# Patient Record
Sex: Male | Born: 1960 | Race: Black or African American | Hispanic: No | Marital: Single | Smoking: Current some day smoker
Health system: Southern US, Community
[De-identification: ages and names within clinical notes are randomized; demographics above are authoritative.]

## PROBLEM LIST (undated history)

## (undated) DIAGNOSIS — I10 Essential (primary) hypertension: Secondary | ICD-10-CM

## (undated) HISTORY — DX: Essential (primary) hypertension: I10

## (undated) HISTORY — PX: HERNIA REPAIR: SHX51

---

## 2001-01-20 ENCOUNTER — Encounter: Payer: Self-pay | Admitting: Internal Medicine

## 2001-01-20 ENCOUNTER — Encounter: Admission: RE | Admit: 2001-01-20 | Discharge: 2001-01-20 | Payer: Self-pay | Admitting: Internal Medicine

## 2001-02-02 ENCOUNTER — Encounter: Payer: Self-pay | Admitting: Internal Medicine

## 2001-02-02 ENCOUNTER — Encounter: Admission: RE | Admit: 2001-02-02 | Discharge: 2001-02-02 | Payer: Self-pay | Admitting: Internal Medicine

## 2002-01-10 ENCOUNTER — Emergency Department (HOSPITAL_COMMUNITY): Admission: EM | Admit: 2002-01-10 | Discharge: 2002-01-10 | Payer: Self-pay

## 2002-01-20 ENCOUNTER — Emergency Department (HOSPITAL_COMMUNITY): Admission: EM | Admit: 2002-01-20 | Discharge: 2002-01-20 | Payer: Self-pay | Admitting: Emergency Medicine

## 2002-01-26 ENCOUNTER — Emergency Department (HOSPITAL_COMMUNITY): Admission: EM | Admit: 2002-01-26 | Discharge: 2002-01-26 | Payer: Self-pay | Admitting: Emergency Medicine

## 2003-08-06 ENCOUNTER — Encounter: Payer: Self-pay | Admitting: Internal Medicine

## 2003-08-06 ENCOUNTER — Ambulatory Visit (HOSPITAL_COMMUNITY): Admission: RE | Admit: 2003-08-06 | Discharge: 2003-08-06 | Payer: Self-pay | Admitting: Internal Medicine

## 2007-02-06 ENCOUNTER — Ambulatory Visit: Payer: Self-pay | Admitting: Family Medicine

## 2007-02-06 ENCOUNTER — Inpatient Hospital Stay (HOSPITAL_COMMUNITY): Admission: EM | Admit: 2007-02-06 | Discharge: 2007-02-08 | Payer: Self-pay | Admitting: Emergency Medicine

## 2007-03-01 ENCOUNTER — Encounter (INDEPENDENT_AMBULATORY_CARE_PROVIDER_SITE_OTHER): Payer: Self-pay | Admitting: Family Medicine

## 2007-03-01 ENCOUNTER — Ambulatory Visit: Payer: Self-pay | Admitting: Family Medicine

## 2007-03-01 DIAGNOSIS — L0291 Cutaneous abscess, unspecified: Secondary | ICD-10-CM | POA: Insufficient documentation

## 2007-03-01 DIAGNOSIS — F17200 Nicotine dependence, unspecified, uncomplicated: Secondary | ICD-10-CM

## 2007-03-01 DIAGNOSIS — L0390 Cellulitis, unspecified: Secondary | ICD-10-CM

## 2007-03-01 DIAGNOSIS — I10 Essential (primary) hypertension: Secondary | ICD-10-CM

## 2007-04-25 ENCOUNTER — Encounter (INDEPENDENT_AMBULATORY_CARE_PROVIDER_SITE_OTHER): Payer: Self-pay | Admitting: Family Medicine

## 2007-05-24 ENCOUNTER — Encounter (INDEPENDENT_AMBULATORY_CARE_PROVIDER_SITE_OTHER): Payer: Self-pay | Admitting: Family Medicine

## 2007-05-24 ENCOUNTER — Ambulatory Visit: Payer: Self-pay | Admitting: Family Medicine

## 2007-05-24 LAB — CONVERTED CEMR LAB
AST: 16 units/L
AST: 16 units/L
Albumin: 4.4 g/dL
Albumin: 4.4 g/dL
Alkaline Phosphatase: 64 units/L
BUN: 11 mg/dL
CO2, serum: 23 mmol/L
CO2, serum: 23 mmol/L
Calcium: 8.6 mg/dL
Chloride, Serum: 109 mmol/L
Creatinine, Ser: 0.92 mg/dL
Glucose, Bld: 95 mg/dL
Potassium, serum: 3.2 mmol/L
Sodium, serum: 143 mmol/L
Total Bilirubin: 0.4 mg/dL
Total Protein: 7 g/dL

## 2007-05-31 LAB — CONVERTED CEMR LAB
ALT: 18 units/L (ref 0–53)
Albumin: 4.4 g/dL (ref 3.5–5.2)
Alkaline Phosphatase: 64 units/L (ref 39–117)
BUN: 11 mg/dL (ref 6–23)
Glucose, Bld: 95 mg/dL (ref 70–99)
Potassium: 3.2 meq/L — ABNORMAL LOW (ref 3.5–5.3)

## 2010-03-18 ENCOUNTER — Ambulatory Visit (HOSPITAL_COMMUNITY): Admission: RE | Admit: 2010-03-18 | Discharge: 2010-03-18 | Payer: Self-pay | Admitting: Nephrology

## 2010-05-21 ENCOUNTER — Encounter: Admission: RE | Admit: 2010-05-21 | Discharge: 2010-05-21 | Payer: Self-pay | Admitting: Interventional Radiology

## 2010-11-02 HISTORY — PX: ADRENAL GLAND SURGERY: SHX544

## 2011-01-19 LAB — ALDOSTERONE
Aldosterone, Serum: 19 ng/dL
Aldosterone, Serum: 20 ng/dL
Aldosterone, Serum: 23 ng/dL
Aldosterone, Serum: 41 ng/dL
Aldosterone, Serum: 41 ng/dL
Aldosterone, Serum: 5590 ng/dL
Aldosterone, Serum: 82 ng/dL

## 2011-01-19 LAB — CBC: Platelets: 164 10*3/uL (ref 150–400)

## 2011-01-19 LAB — BASIC METABOLIC PANEL
GFR calc Af Amer: >60 mL/min (ref >60)
Glucose, Bld: 106 mg/dL — ABNORMAL HIGH (ref 70–99)
Potassium: 3 mEq/L — ABNORMAL LOW (ref 3.5–5.1)
Sodium: 141 mEq/L (ref 135–145)

## 2011-01-19 LAB — CORTISOL
Cortisol, Plasma: 1429.6 ug/dL
Cortisol, Plasma: 29.5 ug/dL
Cortisol, Plasma: 7.2 ug/dL
Cortisol, Plasma: 8 ug/dL

## 2011-01-19 LAB — APTT: aPTT: 29 seconds (ref 24–37)

## 2011-01-19 LAB — PROTIME-INR: Prothrombin Time: 13.4 seconds (ref 11.6–15.2)

## 2011-03-20 NOTE — Discharge Summary (Signed)
NAMESABIEN, UMLAND             ACCOUNT NO.:  [id number]   MEDICAL RECORD NO.:  [id number]          PATIENT TYPE:  INP   LOCATION:  5735                         FACILITY:  [facility]   PHYSICIAN:  Santiago Bumpers. Hensel, M.D.DATE OF BIRTH:  11-04-1960   DATE OF ADMISSION:  02/06/2007  DATE OF DISCHARGE:  02/08/2007                               DISCHARGE SUMMARY   DISCHARGE DIAGNOSES:  1. Cellulitis of the mons pubis.  2. Hypertension.   PRIMARY CARE PHYSICIAN:  The patient has previously been seen at [facility]; however, the patient desires to establish at the [facility] at the time of followup.   DISCHARGE MEDICATIONS:  1. Bactrim double-strength one tablet p.o. b.i.d. for 14 days.  2. Vicodin 5/500 one to two tablets every 6 hours as needed for pain,      dispensed #20, no refills.  3. The patient is to resume previous blood pressure medications of      Norvasc, lisinopril and Lopressor, the doses of which he is unaware      in the hospital.   CONSULTATIONS:  None.   PROCEDURES:  Ultrasound of the mons pubis on February 07, 2007 shows  inflammatory change in the fat in the left suprapubic area without  evidence of drainable soft tissue abscess.   LABORATORY DATA:  On admission, the patient had an elevated white blood  cell count of 12.5, hemoglobin 13.6, hematocrit of 40.3, platelets of  163,000 with a left neutrophilic shift of 80% and an ANC of 10.0.  Urinalysis and micro on admission were negative for infection.  Blood  cultures x2 obtained on February 06, 2007 showed no growth to date at the  time of discharge.  RPR was nonreactive.  The patient's white blood cell  count decreased to 12.3 on the second day of hospitalization, but had  significantly decreased to 7.9 at the time of discharge.  Discharge CBC  shows a white blood cell count of 7.9, hemoglobin 13.5, hematocrit of  39.6 and platelets of 188,000.  Creatinine on admission was 1.2.   BRIEF HOSPITAL  COURSE:  Please see full dictated history and physical  for full details of initial presentation and workup.  In brief, this is  a 50 year old African American male with past medical history  significant only for hypertension, who presents with severe cellulitis  of the mons pubis.   PROBLEM #1 - CELLULITIS:  The patient was initially admitted and placed  on IV vancomycin to cover MRSA.  The patient was maintained on IV  vancomycin for a total of approximately 36 hours, as the patient had not  significantly improved after 12 hours.  Ultrasound was obtained to  ensure that there was no presence of abscess that was drainable prior to  discharge.  Ultrasound was negative for abscess.  On the morning of  discharge, the patient's erythema and area of induration had  significantly decreased and the patient's cellulitis was spontaneously  draining a significant amount of purulent drainage.  The patient felt  significantly better, had been afebrile for at least 24 hours and white  blood cell count had normalized.  The patient was thus switched to p.o.  Bactrim to cover MRSA prior to discharge.  The patient was also given  Vicodin as needed at the time of discharge for pain control.   PROBLEM #2 - HYPERTENSION:  The patient was unaware of home medications  and dosages on admission, thus was not begun on any blood pressure  medications, as his blood pressure was only mildly elevated.  The  patient's blood pressure was somewhat increased prior to discharge to  150s to 170s over 80s to 90s and thus the patient was advised to resume  his home blood pressure medications at the time of discharge.   PROBLEM #3 - TOBACCO USE:  The patient had previously used occasional  tobacco prior to admission, however, reports not using any since last  Thursday and intends to quit.  The patient denied desiring any  medications to assist for quitting.   FOLLOWUP APPOINTMENTS:  1. The patient has a followup  appointment with Dr. Deirdre Peer at [facility] to establish care on March 01, 2007 at 3:30      p.m.  2. The patient was advised to call for walk-in hours and be seen      within the next week at [facility] to ensure resolution of      his infection.   PENDING RESULTS AND ISSUES TO BE FOLLOWED AT THE TIME OF DISCHARGE:  1. The patient was advised to bring his blood pressure medicines to      followup appointment for monitoring of his hypertension.  2. Followup blood cultures, which are pending at the time of      discharge.   ADDITIONAL INSTRUCTIONS:  1,  The patient is not to drive while taking  Vicodin.  1. The patient is to call sooner to Urgent Care for followup within      the next week or sooner if he has persistent fever or chills,      increased pain or his infection does not improve.  2. The patient is to bring his medications a followup visit.  3. The patient is to follow a low-sodium heart-healthy diet and has no      activity restrictions.  He is to continue applying warm compresses      at least 3-4 times daily to his area of cellulitis.   CONDITION ON DISCHARGE:  The patient was discharged home in stable  medical condition.     ______________________________  Drue Dun, M.D.    ______________________________  Santiago Bumpers. Leveda Anna, M.D.    EE/MEDQ  D:  02/08/2007  T:  02/09/2007  Job:  [id number]

## 2011-03-20 NOTE — H&P (Signed)
[name], [name]             ACCOUNT NO.:  [id number]   MEDICAL RECORD NO.:  [id number]          PATIENT TYPE:  INP   LOCATION:  5735                         FACILITY:  [facility]   PHYSICIAN:  Santiago Bumpers. Hensel, M.D.DATE OF BIRTH:  [birth year]   DATE OF ADMISSION:  02/06/2007  DATE OF DISCHARGE:                              HISTORY & PHYSICAL   CHIEF COMPLAINT:  A boil.   PRIMARY CARE PHYSICIAN:  [facility].   HISTORY OF PRESENT ILLNESS:  The patient is a 50 year old otherwise  healthy African American gentleman who complains of a ingrown hair in  his groin region just above the base of the penis; he noticed this 4  days ago.  The patient scratched the area with his fingernails in an  attempt to pop it.  Since then, the patient reports erythema,  swelling, purulent discharge from a pinpoint spot in the center of the  lesion.  He also notes subjective fever and chills.  The patient recalls  a prior occasion where he had a similar-type boil on the side of his  torso with spontaneous resolution.  The patient notes no dysuria or  neuromotor symptoms involving the penis.  The patient is voiding freely.   PAST MEDICAL HISTORY:  Hypertension.   PAST SURGICAL HISTORY:  Negative.   ALLERGIES:  Negative.   MEDICATIONS:  Negative.   FAMILY HISTORY:  Diabetes and hypertension.   SOCIAL HISTORY:  The patient is a Naval architect.  He lives in [place]  and has been here for 18 years.  He reports occasional tobacco and  moonshine use socially.  Denies illicit drugs of abuse.   REVIEW OF SYSTEMS:  Remarkable for fever, chills, pain and purulent  drainage with induration, swelling and erythema.   PHYSICAL EXAM:  VITALS:  T-max 99.4, heart rate 75, BP 119/65,  respirations 12, O2 SAT 95% on room air.  GENERAL:  The patient is awake, alert, in no apparent distress.  CARDIAC:  Regular rate and rhythm.  No murmur.  PULMONARY:  Clear to auscultation bilaterally.  GI:  Abdomen soft.   Positive bowel sounds.  GU:  A 5-cm indurated, nonfluctuant lesion above the base of the penis  on the mons pubis with an area of focal purulent drainage.  Notable for  tenderness to palpation on the area of induration.  Otherwise groin and  inguen nontender.  EXTREMITIES:  No edema.   LABORATORY DATA:  WBC 12.5, hemoglobin 13.6, platelets 163.  BMET within  normal limits.  Blood culture, UA, RPR pending.   ASSESSMENT AND PLAN:  The patient is a 50 year old gentleman with  cellulitis:  1. Cellulitis:  No clear focal target for surgical I&D.  Will observe      for 23 hours with intravenous vancomycin, then consider changing to      oral Bactrim.  Will follow fever curve and blood cultures.  2. Hypertension:  Blood pressure controlled.  3. Fluids, electrolytes and nutrition/gastrointestinal:  Regular diet.  4. Disposition:  Will follow overnight, likely home in the morning.      Towana Badger, M.D.  ______________________________  Santiago Bumpers Leveda Anna, M.D.    JP/MEDQ  D:  02/07/2007  T:  02/07/2007  Job:  [id number]

## 2011-05-12 ENCOUNTER — Other Ambulatory Visit (INDEPENDENT_AMBULATORY_CARE_PROVIDER_SITE_OTHER): Payer: Self-pay | Admitting: Surgery

## 2011-05-12 ENCOUNTER — Ambulatory Visit (HOSPITAL_COMMUNITY)
Admission: RE | Admit: 2011-05-12 | Discharge: 2011-05-12 | Disposition: A | Discharge status: Home / Self Care | Payer: BC Managed Care – PPO | Source: Ambulatory Visit | Attending: Surgery | Admitting: Surgery

## 2011-05-12 ENCOUNTER — Encounter (HOSPITAL_COMMUNITY): Payer: BC Managed Care – PPO

## 2011-05-12 DIAGNOSIS — D369 Benign neoplasm, unspecified site: Secondary | ICD-10-CM

## 2011-05-12 DIAGNOSIS — D3500 Benign neoplasm of unspecified adrenal gland: Secondary | ICD-10-CM | POA: Insufficient documentation

## 2011-05-12 DIAGNOSIS — Z01818 Encounter for other preprocedural examination: Secondary | ICD-10-CM | POA: Insufficient documentation

## 2011-05-12 DIAGNOSIS — Z01812 Encounter for preprocedural laboratory examination: Secondary | ICD-10-CM | POA: Insufficient documentation

## 2011-05-12 DIAGNOSIS — Z01810 Encounter for preprocedural cardiovascular examination: Secondary | ICD-10-CM | POA: Insufficient documentation

## 2011-05-12 LAB — COMPREHENSIVE METABOLIC PANEL
AST: 19 U/L (ref 0–37)
BUN: 13 mg/dL (ref 6–23)
CO2: 30 mEq/L (ref 19–32)
Chloride: 104 mEq/L (ref 96–112)
Creatinine, Ser: 0.95 mg/dL (ref 0.50–1.35)
GFR calc non Af Amer: >60 mL/min (ref >60)
Total Bilirubin: 0.5 mg/dL (ref 0.3–1.2)

## 2011-05-12 LAB — DIFFERENTIAL
Basophils Absolute: 0 10*3/uL (ref 0.0–0.1)
Eosinophils Relative: 1 % (ref 0–5)
Lymphocytes Relative: 33 % (ref 12–46)
Lymphs Abs: 1.7 10*3/uL (ref 0.7–4.0)
Monocytes Absolute: 0.4 10*3/uL (ref 0.1–1.0)

## 2011-05-12 LAB — CBC
HCT: 42.6 % (ref 39.0–52.0)
MCHC: 34 g/dL (ref 30.0–36.0)
MCV: 83.4 fL (ref 78.0–100.0)
RDW: 14 % (ref 11.5–15.5)

## 2011-05-12 LAB — SURGICAL PCR SCREEN: MRSA, PCR: NEGATIVE

## 2011-05-12 LAB — URINALYSIS, ROUTINE W REFLEX MICROSCOPIC
Bilirubin Urine: NEGATIVE
Hgb urine dipstick: NEGATIVE
Ketones, ur: NEGATIVE mg/dL
Protein, ur: NEGATIVE mg/dL
Urobilinogen, UA: 0.2 mg/dL (ref 0.0–1.0)

## 2011-05-19 ENCOUNTER — Other Ambulatory Visit (INDEPENDENT_AMBULATORY_CARE_PROVIDER_SITE_OTHER): Payer: Self-pay | Admitting: Surgery

## 2011-05-19 ENCOUNTER — Ambulatory Visit (HOSPITAL_COMMUNITY)
Admission: RE | Admit: 2011-05-19 | Discharge: 2011-05-21 | Disposition: A | Discharge status: Home / Self Care | Payer: BC Managed Care – PPO | Source: Ambulatory Visit | Attending: Surgery | Admitting: Surgery

## 2011-05-19 DIAGNOSIS — Z01818 Encounter for other preprocedural examination: Secondary | ICD-10-CM | POA: Insufficient documentation

## 2011-05-19 DIAGNOSIS — F17200 Nicotine dependence, unspecified, uncomplicated: Secondary | ICD-10-CM | POA: Insufficient documentation

## 2011-05-19 DIAGNOSIS — E269 Hyperaldosteronism, unspecified: Secondary | ICD-10-CM | POA: Insufficient documentation

## 2011-05-19 DIAGNOSIS — E669 Obesity, unspecified: Secondary | ICD-10-CM | POA: Insufficient documentation

## 2011-05-19 DIAGNOSIS — E876 Hypokalemia: Secondary | ICD-10-CM | POA: Insufficient documentation

## 2011-05-19 DIAGNOSIS — Z01810 Encounter for preprocedural cardiovascular examination: Secondary | ICD-10-CM | POA: Insufficient documentation

## 2011-05-19 DIAGNOSIS — I10 Essential (primary) hypertension: Secondary | ICD-10-CM | POA: Insufficient documentation

## 2011-05-19 DIAGNOSIS — Z01812 Encounter for preprocedural laboratory examination: Secondary | ICD-10-CM | POA: Insufficient documentation

## 2011-05-19 DIAGNOSIS — D3500 Benign neoplasm of unspecified adrenal gland: Principal | ICD-10-CM | POA: Insufficient documentation

## 2011-05-19 NOTE — Op Note (Signed)
NAMEHENOK, [name]             ACCOUNT NO.:  [id number]  MEDICAL RECORD NO.:  [id number]  LOCATION:  0010                         FACILITY:  [facility]  PHYSICIAN:  Velora Heckler, MD      DATE OF BIRTH:  1961-04-02  DATE OF PROCEDURE:  05/19/2011                               OPERATIVE REPORT   PREOPERATIVE DIAGNOSIS:  Hyperaldosteronism, left adrenal adenoma.  POSTOPERATIVE DIAGNOSIS:  Hyperaldosteronism, left adrenal adenoma.  PROCEDURE:  Laparoscopic left adrenalectomy.  SURGEON:  Velora Heckler, MD, FACS  ASSISTANT:  Adolph Pollack, MD, FACS  ANESTHESIA:  General.  ESTIMATED BLOOD LOSS:  Minimal.  PREPARATION:  ChloraPrep.  COMPLICATIONS:  None.  INDICATIONS:  The patient is a 49 year old black male referred by Dr. Casimiro Needle with hypertension, hyperaldosteronism, and left adrenal adenoma.  The patient has been prepared and brought to the operating room for left adrenalectomy.  BODY OF REPORT:  Procedure is done in OR #10 at the [facility].  The patient is brought to the operating room, placed in supine position on the operating room table.  Following administration of general anesthesia, the patient is turned to a right lateral decubitus position on the bean bag and the bean bag was inflated.  The patient was then prepped and draped in usual strict aseptic fashion.  Utilizing an 11-mm OptiVu trocar, the peritoneal cavity is accessed safely.  Pneumoperitoneum was established.  Operative ports are placed along the left costal margin in the anterior axillary line, the midaxillary line, and the posterior axillary line.  The splenic flexure of the colon is mobilized using the harmonic scalpel to divide the peritoneal attachments.  This exposes the lateral aspect of the left kidney.  The peritoneum lateral to the spleen is incised with a harmonic scalpel.  Dissection was carried cephalad around the spleen.  This allows the spleen and pancreas  to be mobilized on a pedicle medially. This exposes the area of the left adrenal gland and provides good access to the renal hilum.  Good hemostasis is maintained.  A fourth trocar was placed in the left upper quadrant of the abdomen with a 5-mm port. Dissection is then begun on the medial aspect of the kidney.  The adrenal gland is identified.  Using the harmonic scalpel, the adrenal gland is mobilized.  Vascular tributaries were divided with the harmonic scalpel.  Adrenal gland is mobilized off the surface of the kidney. Dissection is carried down to the renal hilum.  The adrenal vein was then gently dissected out with blunt dissection.  It is occluded with two 10-mm Ligaclips.  It is then transected sharply with scissors. Remaining attachments of the adrenal gland to the renal hilum were divided with the harmonic scalpel.  A small posterior venous tributary is divided between Ligaclips with harmonic scalpel.  The entire adrenal gland was then excised.  It is placed into an EndoCatch bag and withdrawn through the 12-mm trocar site without difficulty.  It is opened on the back table.  The adrenal gland is incised and the adenoma was clearly identified within the adrenal parenchyma.  It is submitted in its entirety to pathology for review.  Left upper quadrant was  inspected for hemostasis.  Fluid is evacuated. It was irrigated.  There is no evidence of bleeding.  Surgicel was placed in the operative field at the renal hilum and along these splenic bed.  Pneumoperitoneum was released.  Ports were removed under direct vision and good hemostasis is noted at all port sites.  Pneumoperitoneum was evacuated.  Port sites were anesthetized with local Exparel, local anesthetic.  Skin incisions were then closed with interrupted 4-0 Monocryl subcuticular sutures.  Wounds were washed and dried and Benzoin and Steri-Strips are applied.  Sterile dressings were applied.  The patient is awakened from  anesthesia and brought to the recovery room. The patient tolerated the procedure well.   Velora Heckler, MD, FACS     TMG/MEDQ  D:  05/19/2011  T:  05/19/2011  Job:  [id number]  cc:   Mindi Slicker. Lowell Guitar, M.D. Fax: [phone]  Electronically Signed by Darnell Level MD on 05/19/2011 12:50:02 PM

## 2011-05-20 LAB — BASIC METABOLIC PANEL
BUN: 12 mg/dL (ref 6–23)
CO2: 33 mEq/L — ABNORMAL HIGH (ref 19–32)
Calcium: 9.6 mg/dL (ref 8.4–10.5)
Chloride: 102 mEq/L (ref 96–112)
Creatinine, Ser: 1.12 mg/dL (ref 0.50–1.35)
Glucose, Bld: 108 mg/dL — ABNORMAL HIGH (ref 70–99)

## 2011-05-20 LAB — CBC
HCT: 41.4 % (ref 39.0–52.0)
MCH: 28.3 pg (ref 26.0–34.0)
MCV: 85 fL (ref 78.0–100.0)
Platelets: 171 10*3/uL (ref 150–400)
RBC: 4.87 MIL/uL (ref 4.22–5.81)
WBC: 7 10*3/uL (ref 4.0–10.5)

## 2011-05-27 ENCOUNTER — Other Ambulatory Visit (INDEPENDENT_AMBULATORY_CARE_PROVIDER_SITE_OTHER): Payer: Self-pay | Admitting: Surgery

## 2011-05-27 LAB — BASIC METABOLIC PANEL
BUN: 18 mg/dL (ref 6–23)
CO2: 28 mEq/L (ref 19–32)
Calcium: 9.8 mg/dL (ref 8.4–10.5)
Chloride: 105 mEq/L (ref 96–112)
Creat: 1.11 mg/dL (ref 0.50–1.35)
Glucose, Bld: 72 mg/dL (ref 70–99)

## 2011-06-16 ENCOUNTER — Encounter (INDEPENDENT_AMBULATORY_CARE_PROVIDER_SITE_OTHER): Payer: Self-pay | Admitting: Surgery

## 2011-06-17 ENCOUNTER — Encounter (INDEPENDENT_AMBULATORY_CARE_PROVIDER_SITE_OTHER): Payer: Self-pay | Admitting: Surgery

## 2011-06-17 ENCOUNTER — Ambulatory Visit (INDEPENDENT_AMBULATORY_CARE_PROVIDER_SITE_OTHER): Payer: BC Managed Care – PPO | Admitting: Surgery

## 2011-06-17 VITALS — BP 136/92 | HR 68 | Temp 96.8°F

## 2011-06-17 DIAGNOSIS — D3500 Benign neoplasm of unspecified adrenal gland: Secondary | ICD-10-CM

## 2011-06-17 NOTE — Progress Notes (Signed)
Visit Diagnoses: 1. Adrenal cortical adenoma     HISTORY: The patient is a 50 year old black male who underwent left adrenalectomy in July 2012 for cortical adenoma. Postoperatively his antihypertensive regimen has been decreased by Dr. Lowell Guitar. He has also decreased his dose of potassium supplements. The patient returns today for postoperative evaluation.   EXAM: Laparoscopic incisions along the left costal margin are well healed. No sign of herniation. No sign of infection. No tenderness.   IMPRESSION: Left adrenalectomy for cortical adenoma   PLAN: Patient will be released to return to work without restriction on July 04, 2011. He will continue to followup with his primary physician. He will return to see me as needed.   Velora Heckler, MD, FACS General & Endocrine Surgery [facility]

## 2011-07-08 ENCOUNTER — Encounter: Payer: Self-pay | Admitting: Surgery

## 2011-11-11 ENCOUNTER — Ambulatory Visit: Payer: Self-pay

## 2011-11-12 ENCOUNTER — Encounter (INDEPENDENT_AMBULATORY_CARE_PROVIDER_SITE_OTHER): Payer: Self-pay

## 2012-01-20 ENCOUNTER — Encounter (INDEPENDENT_AMBULATORY_CARE_PROVIDER_SITE_OTHER): Payer: Self-pay

## 2014-03-09 ENCOUNTER — Emergency Department (HOSPITAL_COMMUNITY)
Admission: EM | Admit: 2014-03-09 | Discharge: 2014-03-10 | Disposition: A | Discharge status: Home / Self Care | Payer: BC Managed Care – PPO | Attending: Emergency Medicine | Admitting: Emergency Medicine

## 2014-03-09 ENCOUNTER — Encounter (HOSPITAL_COMMUNITY): Payer: Self-pay | Admitting: Emergency Medicine

## 2014-03-09 DIAGNOSIS — L03319 Cellulitis of trunk, unspecified: Principal | ICD-10-CM

## 2014-03-09 DIAGNOSIS — L02219 Cutaneous abscess of trunk, unspecified: Secondary | ICD-10-CM | POA: Insufficient documentation

## 2014-03-09 DIAGNOSIS — I10 Essential (primary) hypertension: Secondary | ICD-10-CM | POA: Insufficient documentation

## 2014-03-09 DIAGNOSIS — Z79899 Other long term (current) drug therapy: Secondary | ICD-10-CM | POA: Insufficient documentation

## 2014-03-09 DIAGNOSIS — L03311 Cellulitis of abdominal wall: Secondary | ICD-10-CM

## 2014-03-09 NOTE — ED Notes (Signed)
Pt arrived to the ED with a complaint of an abscess on the lower abdomen area that is rubbing his pants and has broken open with discharge.  Pt has been experiencing symptoms for 2 days.  Pt has had these type of abscess previously

## 2014-03-10 LAB — BASIC METABOLIC PANEL
BUN: 15 mg/dL (ref 6–23)
CALCIUM: 9 mg/dL (ref 8.4–10.5)
CO2: 25 meq/L (ref 19–32)
Chloride: 99 mEq/L (ref 96–112)
Creatinine, Ser: 1.13 mg/dL (ref 0.50–1.35)
GFR calc Af Amer: 85 mL/min — ABNORMAL LOW (ref >90)
GFR calc non Af Amer: 73 mL/min — ABNORMAL LOW (ref >90)
GLUCOSE: 101 mg/dL — AB (ref 70–99)
Potassium: 3.7 mEq/L (ref 3.7–5.3)
SODIUM: 137 meq/L (ref 137–147)

## 2014-03-10 LAB — CBC WITH DIFFERENTIAL/PLATELET
Basophils Absolute: 0 10*3/uL (ref 0.0–0.1)
Basophils Relative: 0 % (ref 0–1)
EOS PCT: 0 % (ref 0–5)
Eosinophils Absolute: 0 10*3/uL (ref 0.0–0.7)
HEMATOCRIT: 40.6 % (ref 39.0–52.0)
Hemoglobin: 14 g/dL (ref 13.0–17.0)
LYMPHS ABS: 1.3 10*3/uL (ref 0.7–4.0)
LYMPHS PCT: 13 % (ref 12–46)
MCH: 29.4 pg (ref 26.0–34.0)
MCHC: 34.5 g/dL (ref 30.0–36.0)
MCV: 85.1 fL (ref 78.0–100.0)
MONO ABS: 1.1 10*3/uL — AB (ref 0.1–1.0)
Monocytes Relative: 11 % (ref 3–12)
Neutro Abs: 7.4 10*3/uL (ref 1.7–7.7)
Neutrophils Relative %: 76 % (ref 43–77)
PLATELETS: 162 10*3/uL (ref 150–400)
RBC: 4.77 MIL/uL (ref 4.22–5.81)
RDW: 14.7 % (ref 11.5–15.5)
WBC: 9.8 10*3/uL (ref 4.0–10.5)

## 2014-03-10 MED ORDER — CLINDAMYCIN HCL 300 MG PO CAPS: 300.0000 mg | ORAL_CAPSULE | Freq: Three times a day (TID) | ORAL | Status: DC

## 2014-03-10 MED ORDER — HYDROCODONE-ACETAMINOPHEN 5-325 MG PO TABS: 1.0000 | ORAL_TABLET | ORAL | Status: DC | PRN

## 2014-03-10 MED ORDER — ACETAMINOPHEN 325 MG PO TABS
650.0000 mg | ORAL_TABLET | Freq: Four times a day (QID) | ORAL | Status: DC | PRN
  Administered 2014-03-10: 650 mg via ORAL
  Filled 2014-03-10: qty 2

## 2014-03-10 MED ORDER — CLINDAMYCIN PHOSPHATE 600 MG/50ML IV SOLN
600.0000 mg | Freq: Once | INTRAVENOUS | Status: AC
Start: 2014-03-10 — End: 2014-03-10
  Administered 2014-03-10: 600 mg via INTRAVENOUS
  Filled 2014-03-10: qty 50

## 2014-03-10 NOTE — ED Provider Notes (Signed)
Medical screening examination/treatment/procedure(s) were performed by non-physician practitioner and as supervising physician I was immediately available for consultation/collaboration.   EKG Interpretation None        Katrine Radich L Reyes Fifield, MD 03/10/14 0742 

## 2014-03-10 NOTE — Discharge Instructions (Signed)
Take antibiotic as prescribed.  Take vicodin as prescribed for severe pain.  Do not drive within four hours of taking this medication (may cause drowsiness or confusion).   Please return to the ER on Sunday to have your abdominal wall rechecked.  Return sooner if redness continues to spread or pain worsens.   Cellulitis Cellulitis is an infection of the skin and the tissue beneath it. The infected area is usually red and tender. Cellulitis occurs most often in the arms and lower legs.  CAUSES  Cellulitis is caused by bacteria that enter the skin through cracks or cuts in the skin. The most common types of bacteria that cause cellulitis are Staphylococcus and Streptococcus. SYMPTOMS   Redness and warmth.  Swelling.  Tenderness or pain.  Fever. DIAGNOSIS  Your caregiver can usually determine what is wrong based on a physical exam. Blood tests may also be done. TREATMENT  Treatment usually involves taking an antibiotic medicine. HOME CARE INSTRUCTIONS   Take your antibiotics as directed. Finish them even if you start to feel better.  Keep the infected arm or leg elevated to reduce swelling.  Apply a warm cloth to the affected area up to 4 times per day to relieve pain.  Only take over-the-counter or prescription medicines for pain, discomfort, or fever as directed by your caregiver.  Keep all follow-up appointments as directed by your caregiver. SEEK MEDICAL CARE IF:   You notice red streaks coming from the infected area.  Your red area gets larger or turns dark in color.  Your bone or joint underneath the infected area becomes painful after the skin has healed.  Your infection returns in the same area or another area.  You notice a swollen bump in the infected area.  You develop new symptoms. SEEK IMMEDIATE MEDICAL CARE IF:   You have a fever.  You feel very sleepy.  You develop vomiting or diarrhea.  You have a general ill feeling (malaise) with muscle aches and  pains. MAKE SURE YOU:   Understand these instructions.  Will watch your condition.  Will get help right away if you are not doing well or get worse. Document Released: 07/29/2005 Document Revised: 04/19/2012 Document Reviewed: 01/04/2012 [facility] Patient Information 2014 [place].

## 2014-03-10 NOTE — ED Provider Notes (Signed)
CSN: [id number]     Arrival date & time 03/09/14  2251 History   First MD Initiated Contact with Patient 03/10/14 [phone]     Chief Complaint  Patient presents with  . Abscess  . Fever     (Consider location/radiation/quality/duration/timing/severity/associated sxs/prior Treatment) HPI History provided by pt.   Pt developed what appeared to be a pimple inferior to [employer] 2 days ago.  Has had gradually spreading surrounding erythema and increasing pain ever since.  There has been drainage of fluid as well as blood.  No associated fever or other skin changes.  No trauma to this area.  Immunocompetent.  Per prior chart, pt admitted for cellulitis of mons pubis in 2008.    Past Medical History  Diagnosis Date  . Hypertension    Past Surgical History  Procedure Laterality Date  . Hernia repair      umbilical   History reviewed. No pertinent family history. History  Substance Use Topics  . Smoking status: Never Smoker   . Smokeless tobacco: Not on file  . Alcohol Use: Yes     Comment: occcasional    Review of Systems  All other systems reviewed and are negative.     Allergies  Review of patient's allergies indicates no known allergies.  Home Medications   Prior to Admission medications   Medication Sig Start Date End Date Taking? Authorizing Provider  amLODipine (NORVASC) 10 MG tablet Take 10 mg by mouth 2 (two) times daily.     Yes Historical Provider, MD  lisinopril (PRINIVIL,ZESTRIL) 40 MG tablet Take 40 mg by mouth 2 (two) times daily.     Yes Historical Provider, MD  metoprolol tartrate (LOPRESSOR) 25 MG tablet Take 25 mg by mouth 2 (two) times daily.   Yes Historical Provider, MD  Multiple Vitamin (MULTIVITAMIN WITH MINERALS) TABS tablet Take 1 tablet by mouth daily.   Yes Historical Provider, MD   BP 126/66  Pulse 70  Temp(Src) 101.6 F (38.7 C) (Rectal)  Resp 18  SpO2 95% Physical Exam  Nursing note and vitals reviewed. Constitutional: He is oriented to person,  place, and time. He appears well-developed and well-nourished. No distress.  febrile  HENT:  Head: Normocephalic and atraumatic.  Eyes:  Normal appearance  Neck: Normal range of motion.  Cardiovascular: Normal rate and regular rhythm.   Pulmonary/Chest: Effort normal and breath sounds normal. No respiratory distress.  Abdominal: Soft. Bowel sounds are normal.  Erythema and warmth that extends nearly all the way across lower abdomen.  Genitalia spared.  Scabbed lesion w/ underlying induration inferior to naval.  Mildly ttp.  Visualized w/ bedside US and no subq fluid collection.  Musculoskeletal: Normal range of motion.  Neurological: He is alert and oriented to person, place, and time.  Skin: Skin is warm and dry. No rash noted.  Psychiatric: He has a normal mood and affect. His behavior is normal.    ED Course  Procedures (including critical care time) Labs Review Labs Reviewed  CBC WITH DIFFERENTIAL - Abnormal; Notable for the following:    Monocytes Absolute 1.1 (*)    All other components within normal limits  BASIC METABOLIC PANEL - Abnormal; Notable for the following:    Glucose, Bld 101 (*)    GFR calc non Af Amer 73 (*)    GFR calc Af Amer 85 (*)    All other components within normal limits    Imaging Review No results found.   EKG Interpretation None  MDM   Final diagnoses:  Cellulitis of abdominal wall    53yo immunocompetent M w/ h/o admission for cellulitis of mons pubis in 2008, presents w/ extensive cellulitis of lower abdominal wall.  No visible abscess when examined w/ bedside US.  Pt febrile but non-toxic and comfortable appearing.  Labs unremarkable. Tylenol administered.  Nursing staff drew margins of erythema.  IV clindamycin ordered.  Pain medication declined.  2:28 AM   Pt stable for discharge.  Prescribed clinda and vicodin.  Instructed to return in 2 days for recheck or sooner for progressing sx.  4:27 AM   Remer Macho,  PA-C 03/10/14 [phone]

## 2014-03-11 ENCOUNTER — Emergency Department (HOSPITAL_COMMUNITY): Payer: BC Managed Care – PPO

## 2014-03-11 ENCOUNTER — Inpatient Hospital Stay (HOSPITAL_COMMUNITY)
Admission: EM | Admit: 2014-03-11 | Discharge: 2014-03-13 | DRG: 872 | Disposition: A | Discharge status: Home / Self Care | Payer: BC Managed Care – PPO | Attending: Internal Medicine | Admitting: Internal Medicine

## 2014-03-11 ENCOUNTER — Encounter (HOSPITAL_COMMUNITY): Payer: Self-pay | Admitting: Emergency Medicine

## 2014-03-11 DIAGNOSIS — I10 Essential (primary) hypertension: Secondary | ICD-10-CM

## 2014-03-11 DIAGNOSIS — A419 Sepsis, unspecified organism: Principal | ICD-10-CM | POA: Diagnosis present

## 2014-03-11 DIAGNOSIS — D3500 Benign neoplasm of unspecified adrenal gland: Secondary | ICD-10-CM | POA: Diagnosis present

## 2014-03-11 DIAGNOSIS — L0390 Cellulitis, unspecified: Secondary | ICD-10-CM

## 2014-03-11 DIAGNOSIS — E669 Obesity, unspecified: Secondary | ICD-10-CM | POA: Diagnosis present

## 2014-03-11 DIAGNOSIS — Z79899 Other long term (current) drug therapy: Secondary | ICD-10-CM

## 2014-03-11 DIAGNOSIS — L03311 Cellulitis of abdominal wall: Secondary | ICD-10-CM | POA: Diagnosis present

## 2014-03-11 DIAGNOSIS — L02219 Cutaneous abscess of trunk, unspecified: Secondary | ICD-10-CM | POA: Diagnosis present

## 2014-03-11 DIAGNOSIS — F17200 Nicotine dependence, unspecified, uncomplicated: Secondary | ICD-10-CM

## 2014-03-11 DIAGNOSIS — L03319 Cellulitis of trunk, unspecified: Secondary | ICD-10-CM

## 2014-03-11 DIAGNOSIS — Z6836 Body mass index (BMI) 36.0-36.9, adult: Secondary | ICD-10-CM

## 2014-03-11 DIAGNOSIS — L0291 Cutaneous abscess, unspecified: Secondary | ICD-10-CM

## 2014-03-11 LAB — CBC WITH DIFFERENTIAL/PLATELET
Basophils Absolute: 0 10*3/uL (ref 0.0–0.1)
Basophils Relative: 0 % (ref 0–1)
EOS ABS: 0.1 10*3/uL (ref 0.0–0.7)
EOS PCT: 1 % (ref 0–5)
HEMATOCRIT: 41.8 % (ref 39.0–52.0)
Hemoglobin: 14.2 g/dL (ref 13.0–17.0)
LYMPHS ABS: 1.2 10*3/uL (ref 0.7–4.0)
Lymphocytes Relative: 13 % (ref 12–46)
MCH: 28.9 pg (ref 26.0–34.0)
MCHC: 34 g/dL (ref 30.0–36.0)
MCV: 85 fL (ref 78.0–100.0)
MONO ABS: 1 10*3/uL (ref 0.1–1.0)
MONOS PCT: 10 % (ref 3–12)
Neutro Abs: 7.3 10*3/uL (ref 1.7–7.7)
Neutrophils Relative %: 76 % (ref 43–77)
PLATELETS: 182 10*3/uL (ref 150–400)
RBC: 4.92 MIL/uL (ref 4.22–5.81)
RDW: 14.4 % (ref 11.5–15.5)
WBC: 9.6 10*3/uL (ref 4.0–10.5)

## 2014-03-11 LAB — BASIC METABOLIC PANEL
BUN: 17 mg/dL (ref 6–23)
CALCIUM: 9.2 mg/dL (ref 8.4–10.5)
CO2: 23 mEq/L (ref 19–32)
CREATININE: 1.19 mg/dL (ref 0.50–1.35)
Chloride: 100 mEq/L (ref 96–112)
GFR calc Af Amer: 80 mL/min — ABNORMAL LOW (ref >90)
GFR, EST NON AFRICAN AMERICAN: 69 mL/min — AB (ref >90)
GLUCOSE: 125 mg/dL — AB (ref 70–99)
Potassium: 3.8 mEq/L (ref 3.7–5.3)
SODIUM: 137 meq/L (ref 137–147)

## 2014-03-11 LAB — I-STAT CG4 LACTIC ACID, ED: Lactic Acid, Venous: 1.05 mmol/L (ref 0.5–2.2)

## 2014-03-11 MED ORDER — IOHEXOL 300 MG/ML  SOLN
100.0000 mL | Freq: Once | INTRAMUSCULAR | Status: AC | PRN
  Administered 2014-03-11: 100 mL via INTRAVENOUS

## 2014-03-11 MED ORDER — VANCOMYCIN HCL IN DEXTROSE 1-5 GM/200ML-% IV SOLN
1000.0000 mg | Freq: Once | INTRAVENOUS | Status: AC
  Administered 2014-03-11: 1000 mg via INTRAVENOUS
  Filled 2014-03-11: qty 200

## 2014-03-11 NOTE — ED Notes (Signed)
Pt states that he was previously seen for skin infection on lower abdomen and has come back for recheck. Area is red, swollen and hot to the touch. Redness circumference has grown. Alert and oriented.

## 2014-03-11 NOTE — ED Provider Notes (Signed)
CSN: [id number]     Arrival date & time 03/11/14  2021 History   This chart was scribed for non-physician practitioner Abigail Butts, PA-C, working with Tanna Furry, MD, by Neta Ehlers, ED Scribe. This patient was seen in room WA02/WA02 and the patient's care was started at 8:31 PM.  First MD Initiated Contact with Patient 03/11/14 2029     Chief Complaint  Patient presents with  . Cellulitis    The history is provided by the patient. No language interpreter was used.   HPI Comments: [name] is a 53 y.o. male, with a h/o HTN and skin infections, who presents to the Emergency Department complaining of a persistent skin infection to his lower abdomen which has been present for approximately four days. [name] was treated in the ED two days ago for the same issue; he was provided IV and PO clindamycin. The pt reports drainage from the site as well as redness, swelling, warmth, and mild pain associated with the site. Additionally, he has experienced chills. While he had a temperature of 101.6 F at the ED visit two days ago, his temperature in the ED today is 98.3 F.  The pt denies abdominal pain, nausea, and emesis. He reports a prior hospital admission seven years ago for similar symptoms, though to a different location. Record review shows that at that time patient was septic from the infection. He takes Norvasc, Lisinopril, and Lopressor for HTN. The pt denies a h/o medical issues including DM.   Past Medical History  Diagnosis Date  . Hypertension    Past Surgical History  Procedure Laterality Date  . Hernia repair      umbilical   History reviewed. No pertinent family history. History  Substance Use Topics  . Smoking status: Never Smoker   . Smokeless tobacco: Not on file  . Alcohol Use: Yes     Comment: occcasional    Review of Systems  Constitutional: Positive for fever (Resolved) and chills. Negative for diaphoresis, appetite change, fatigue and unexpected  weight change.  HENT: Negative for mouth sores.   Eyes: Negative for visual disturbance.  Respiratory: Negative for cough, chest tightness, shortness of breath and wheezing.   Cardiovascular: Negative for chest pain.  Gastrointestinal: Negative for nausea, vomiting, abdominal pain, diarrhea and constipation.  Endocrine: Negative for polydipsia, polyphagia and polyuria.  Genitourinary: Negative for dysuria, urgency, frequency and hematuria.  Musculoskeletal: Negative for back pain and neck stiffness.  Skin: Positive for color change. Negative for rash.  Allergic/Immunologic: Negative for immunocompromised state.  Neurological: Negative for syncope, light-headedness and headaches.  Hematological: Does not bruise/bleed easily.  Psychiatric/Behavioral: Negative for sleep disturbance. The patient is not nervous/anxious.   All other systems reviewed and are negative.   Allergies  Review of patient's allergies indicates no known allergies.  Home Medications   Prior to Admission medications   Medication Sig Start Date End Date Taking? Authorizing Provider  amLODipine (NORVASC) 10 MG tablet Take 10 mg by mouth 2 (two) times daily.      Historical Provider, MD  clindamycin (CLEOCIN) 300 MG capsule Take 1 capsule (300 mg total) by mouth 3 (three) times daily. 03/10/14   Arville Lime Schinlever, PA-C  HYDROcodone-acetaminophen (NORCO/VICODIN) 5-325 MG per tablet Take 1 tablet by mouth every 4 (four) hours as needed for moderate pain. 03/10/14   Arville Lime Schinlever, PA-C  lisinopril (PRINIVIL,ZESTRIL) 40 MG tablet Take 40 mg by mouth 2 (two) times daily.      Historical Provider,  MD  metoprolol tartrate (LOPRESSOR) 25 MG tablet Take 25 mg by mouth 2 (two) times daily.    Historical Provider, MD  Multiple Vitamin (MULTIVITAMIN WITH MINERALS) TABS tablet Take 1 tablet by mouth daily.    Historical Provider, MD   Triage Vitals: BP 134/83  Pulse 90  Temp(Src) 98.3 F (36.8 C) (Oral)  SpO2  96%  Physical Exam  Nursing note and vitals reviewed. Constitutional: He is oriented to person, place, and time. He appears well-developed and well-nourished. No distress.  HENT:  Head: Normocephalic and atraumatic.  Eyes: Conjunctivae are normal. No scleral icterus.  Neck: Normal range of motion.  Cardiovascular: Normal rate, regular rhythm, normal heart sounds and intact distal pulses.   No murmur heard. NO Tachycardia  Pulmonary/Chest: Effort normal and breath sounds normal. No respiratory distress.  Clear and equal breath sounds, no tachypnea  Abdominal: Soft. Bowel sounds are normal. He exhibits no distension. There is no tenderness. There is no rebound and no guarding.  No tenderness to palpation of the abdominal contents, mild superficial tenderness to palpation at the site of infection No rebound, guarding or peritoneal signs  Lymphadenopathy:    He has no cervical adenopathy.  Neurological: He is alert and oriented to person, place, and time. He exhibits normal muscle tone. Coordination normal.  Skin: Skin is warm and dry. He is not diaphoretic. There is erythema.  12 by 14 cm of induration just distal to umbilicus. Extending erythema almost to the axillary line and above the umbilicus. In the center of induration, an ulcerated lesion.    Psychiatric: He has a normal mood and affect.    ED Course  Procedures (including critical care time)  DIAGNOSTIC STUDIES: Oxygen Saturation is 96% on room air, normal by my interpretation.    COORDINATION OF CARE:  8:35 PM- Discussed treatment plan with patient, and the patient agreed to the plan.  The plan includes an ultra-sound.   8:40 PM- EMERGENCY DEPARTMENT US SOFT TISSUE INTERPRETATION "Study: Limited Ultrasound of the noted body part in comments below"  INDICATIONS: Soft tissue infection Multiple views of the body part are obtained with a multi-frequency linear probe  PERFORMED BY:  Myself  IMAGES ARCHIVED?:  Yes  SIDE:Midline  BODY PART:Abdominal wall  FINDINGS: No abcess noted and Cellulitis present  LIMITATIONS:  Body Habitus  INTERPRETATION:  No abcess noted and Cellulitis present  COMMENT:  Cobblestoning to suggest cellulitis, no large collection of fluid for which I and D  would be possible  8:42 PM- Consulted with Dr. Tanna Furry re the pt's course of care.    8:48 PM- Dr. Tanna Furry assessed the pt and recommended a CT scan, lab work, and IV antibiotics.   Labs Review Labs Reviewed  BASIC METABOLIC PANEL - Abnormal; Notable for the following:    Glucose, Bld 125 (*)    GFR calc non Af Amer 69 (*)    GFR calc Af Amer 80 (*)    All other components within normal limits  CULTURE, BLOOD (ROUTINE X 2)  CULTURE, BLOOD (ROUTINE X 2)  CBC WITH DIFFERENTIAL  I-STAT CG4 LACTIC ACID, ED    Imaging Review Ct Abdomen Pelvis W Contrast  03/11/2014   CLINICAL DATA:  Patient returns for follow-up of skin infection on the lower abdomen. The area is read red, swollen, and hot to touch. Cellulitis.  EXAM: CT ABDOMEN AND PELVIS WITH CONTRAST  TECHNIQUE: Multidetector CT imaging of the abdomen and pelvis was performed using the standard protocol  following bolus administration of intravenous contrast.  CONTRAST:  162mL OMNIPAQUE IOHEXOL 300 MG/ML  SOLN  COMPARISON:  None.  FINDINGS: Dependent atelectasis in the lung bases. Small subpleural nodules are demonstrated on the right, largest measuring 3 mm diameter. In a low risk patient, these are likely to be benign.  There is skin thickening and infiltration in the subcutaneous fat along the right lower quadrant lower abdominal wall anteriorly. This is consistent with the history of cellulitis. There is no evidence of any focal fluid collection to suggest abscess.  The liver, spleen, gallbladder, pancreas, adrenal glands, kidneys, abdominal aorta, inferior vena cava, and retroperitoneal lymph nodes are unremarkable. The stomach, small bowel, and colon  are decompressed. No free air or free fluid in the abdomen.  Pelvis: The appendix is normal. Prostate gland is enlarged, measuring 5.3 x 4.5 cm. No free or loculated pelvic fluid collections. No significant pelvic lymphadenopathy. No evidence of diverticulitis. Mild degenerative changes in the spine.  IMPRESSION: Changes consistent with cellulitis in the anterior abdominal wall, right lower quadrant. No abscess. Internal organs and bowel are unremarkable.   Electronically Signed   By: Lucienne Capers M.D.   On: 03/11/2014 21:41     EKG Interpretation None      MDM   Final diagnoses:  Cellulitis, abdominal wall    [name] presents with abdominal cellulitis. Patient has failed outpatient treatment with clindamycin; anticipate admission. The ultrasound does not show an abscess for which incision and drainage would be possible. We'll obtain lab work and CT scan to ensure no necrotizing fasciitis or deep set abscess.    9:50PM Patient remains alert, oriented nontoxic and nonseptic appearing. No leukocytosis lactic acid, the lactic acid and no evidence of sepsis.  We'll plan for admission.  Patient given vancomycin 1 g IV.  10:27 PM Discussed with hospitalist who will admit.  BP 134/83  Pulse 90  Temp(Src) 98.3 F (36.8 C) (Oral)  SpO2 96%  The patient was discussed with and seen by Dr. Tanna Furry who agrees with the treatment plan.   I personally performed the services described in this documentation, which was scribed in my presence. The recorded information has been reviewed and is accurate.   Jarrett Soho Anthonny Schiller, PA-C 03/11/14 2244

## 2014-03-11 NOTE — ED Notes (Signed)
Report to [facility] RN-ready for pt

## 2014-03-11 NOTE — H&P (Signed)
[facility] History and Physical  Patient: [name]  JKK:[id number]  DOB: [birth year]  DOS: the patient was seen and examined on 03/11/2014 PCP: No PCP Per Patient  Chief Complaint: stomach infection  HPI: JOSPH [name] is a 53 y.o. male with Past medical history of hypertension and left adrenalectomy. The pt presented with c/o stomach wall infection. He mentions that Thursday he has noted a boil on his abdomen which he ruptured manually. After that he started having progressive worsening swelling and redness of his abdominal wall. He also started having generalized malaise and chills. Denies any fever. He was seen in the ED on 8 and was started on clindamycin and sent home. He comes back today for followup and found to have further worsening of the swelling despite taking medications regularly. The patient denies any complaint of intra-abdominal pain, diarrhea, burning urination, constipation, active bleeding, pain swelling or redness in his scrotum of suprapubic area. He mentions he had similar infection in 2008 at which time he was in the hospital treated with vancomycin and discharged home on Bactrim.  The patient is coming from home. And at his baseline independent for most of his ADL.  Review of Systems: as mentioned in the history of present illness.  A Comprehensive review of the other systems is negative.  Past Medical History  Diagnosis Date  . Hypertension    Past Surgical History  Procedure Laterality Date  . Hernia repair      umbilical   Social History:  reports that he has never smoked. He does not have any smokeless tobacco history on file. He reports that he drinks alcohol. He reports that he does not use illicit drugs.  No Known Allergies  History reviewed. No pertinent family history.  Prior to Admission medications   Medication Sig Start Date End Date Taking? Authorizing Provider  amLODipine (NORVASC) 10 MG tablet Take 10 mg by mouth 2 (two)  times daily.     Yes Historical Provider, MD  clindamycin (CLEOCIN) 300 MG capsule Take 1 capsule (300 mg total) by mouth 3 (three) times daily. 03/10/14  Yes Catherine E Schinlever, PA-C  HYDROcodone-acetaminophen (NORCO/VICODIN) 5-325 MG per tablet Take 1 tablet by mouth every 4 (four) hours as needed for moderate pain. 03/10/14  Yes Catherine E Schinlever, PA-C  lisinopril (PRINIVIL,ZESTRIL) 40 MG tablet Take 40 mg by mouth 2 (two) times daily.     Yes Historical Provider, MD  metoprolol tartrate (LOPRESSOR) 25 MG tablet Take 25 mg by mouth 2 (two) times daily.   Yes Historical Provider, MD  Multiple Vitamin (MULTIVITAMIN WITH MINERALS) TABS tablet Take 1 tablet by mouth daily.   Yes Historical Provider, MD    Physical Exam: Filed Vitals:   03/11/14 2028 03/11/14 2300  BP: 134/83 139/85  Pulse: 90 78  Temp: 98.3 F (36.8 C) 100 F (37.8 C)  TempSrc: Oral Oral  Resp:  18  SpO2: 96% 95%    General: Alert, Awake and Oriented to Time, Place and Person. Appear in mild distress Eyes: PERRL ENT: Oral Mucosa clear moist. Neck: No JVD Cardiovascular: S1 and S2 Present, no Murmur, Peripheral Pulses Present Respiratory: Bilateral Air entry equal and Decreased, Clear to Auscultation,  no Crackles,no wheezes Abdomen: Bowel Sound Present, Soft and Non tender Diffuse redness infraumbilical skin with warmth and induration. Discrete swollen nodule with ulceration. Skin: no other Rash Extremities: no Pedal edema, no calf tenderness Neurologic: Grossly no focal neuro deficit.  Labs on Admission:  CBC:  Recent  Labs Lab 03/10/14 0130 03/11/14 2105  WBC 9.8 9.6  NEUTROABS 7.4 7.3  HGB 14.0 14.2  HCT 40.6 41.8  MCV 85.1 85.0  PLT 162 182    CMP     Component Value Date/Time   NA 137 03/11/2014 2105   K 3.8 03/11/2014 2105   CL 100 03/11/2014 2105   CO2 23 03/11/2014 2105   GLUCOSE 125* 03/11/2014 2105   BUN 17 03/11/2014 2105   CREATININE 1.19 03/11/2014 2105   CREATININE 1.11 05/27/2011  1238   CALCIUM 9.2 03/11/2014 2105   PROT 7.6 05/12/2011 1110   ALBUMIN 4.3 05/12/2011 1110   AST 19 05/12/2011 1110   ALT 24 05/12/2011 1110   ALKPHOS 71 05/12/2011 1110   BILITOT 0.5 05/12/2011 1110   GFRNONAA 69* 03/11/2014 2105   GFRAA 80* 03/11/2014 2105    No results found for this basename: LIPASE, AMYLASE,  in the last 168 hours No results found for this basename: AMMONIA,  in the last 168 hours  No results found for this basename: CKTOTAL, CKMB, CKMBINDEX, TROPONINI,  in the last 168 hours BNP (last 3 results) No results found for this basename: PROBNP,  in the last 8760 hours  Radiological Exams on Admission: Ct Abdomen Pelvis W Contrast  03/11/2014   CLINICAL DATA:  Patient returns for follow-up of skin infection on the lower abdomen. The area is read red, swollen, and hot to touch. Cellulitis.  EXAM: CT ABDOMEN AND PELVIS WITH CONTRAST  TECHNIQUE: Multidetector CT imaging of the abdomen and pelvis was performed using the standard protocol following bolus administration of intravenous contrast.  CONTRAST:  140mL OMNIPAQUE IOHEXOL 300 MG/ML  SOLN  COMPARISON:  None.  FINDINGS: Dependent atelectasis in the lung bases. Small subpleural nodules are demonstrated on the right, largest measuring 3 mm diameter. In a low risk patient, these are likely to be benign.  There is skin thickening and infiltration in the subcutaneous fat along the right lower quadrant lower abdominal wall anteriorly. This is consistent with the history of cellulitis. There is no evidence of any focal fluid collection to suggest abscess.  The liver, spleen, gallbladder, pancreas, adrenal glands, kidneys, abdominal aorta, inferior vena cava, and retroperitoneal lymph nodes are unremarkable. The stomach, small bowel, and colon are decompressed. No free air or free fluid in the abdomen.  Pelvis: The appendix is normal. Prostate gland is enlarged, measuring 5.3 x 4.5 cm. No free or loculated pelvic fluid collections. No  significant pelvic lymphadenopathy. No evidence of diverticulitis. Mild degenerative changes in the spine.  IMPRESSION: Changes consistent with cellulitis in the anterior abdominal wall, right lower quadrant. No abscess. Internal organs and bowel are unremarkable.   Electronically Signed   By: Lucienne Capers M.D.   On: 03/11/2014 21:41     Assessment/Plan Principal Problem:   Cellulitis, abdominal wall Active Problems:   HYPERTENSION, BENIGN   h/o left Adrenal cortical adenoma   1. Cellulitis, abdominal wall The patient is presenting with complaint of increasingly worsening cellulitis of his abdominal wall. CT scan of the abdomen is performed which is consistent with the same without any abscess. With this the patient will be admitted to the hospital for IV antibiotics, I would continue him on IV vancomycin. If his redness does not improve in 24 hours pseudomonal coverage should be added. Follow blood cultures  2. Hypertension Continue home antihypertensive medications  3. 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  Author: Berle Mull, MD [facility] Pager: [phone] 03/11/2014, 11:19 PM    If 7PM-7AM, please contact night-coverage [email or web address] Password [email or web address]

## 2014-03-12 ENCOUNTER — Encounter (HOSPITAL_COMMUNITY): Payer: Self-pay | Admitting: *Deleted

## 2014-03-12 DIAGNOSIS — D3500 Benign neoplasm of unspecified adrenal gland: Secondary | ICD-10-CM

## 2014-03-12 DIAGNOSIS — F17200 Nicotine dependence, unspecified, uncomplicated: Secondary | ICD-10-CM

## 2014-03-12 DIAGNOSIS — L0390 Cellulitis, unspecified: Secondary | ICD-10-CM

## 2014-03-12 DIAGNOSIS — L02219 Cutaneous abscess of trunk, unspecified: Secondary | ICD-10-CM

## 2014-03-12 DIAGNOSIS — L0291 Cutaneous abscess, unspecified: Secondary | ICD-10-CM

## 2014-03-12 DIAGNOSIS — I10 Essential (primary) hypertension: Secondary | ICD-10-CM

## 2014-03-12 DIAGNOSIS — L03319 Cellulitis of trunk, unspecified: Secondary | ICD-10-CM

## 2014-03-12 LAB — COMPREHENSIVE METABOLIC PANEL
ALK PHOS: 68 U/L (ref 39–117)
ALT: 21 U/L (ref 0–53)
AST: 17 U/L (ref 0–37)
Albumin: 3.4 g/dL — ABNORMAL LOW (ref 3.5–5.2)
BUN: 15 mg/dL (ref 6–23)
CO2: 22 mEq/L (ref 19–32)
Calcium: 8.9 mg/dL (ref 8.4–10.5)
Chloride: 100 mEq/L (ref 96–112)
Creatinine, Ser: 1.08 mg/dL (ref 0.50–1.35)
GFR calc Af Amer: 89 mL/min — ABNORMAL LOW (ref >90)
GFR calc non Af Amer: 77 mL/min — ABNORMAL LOW (ref >90)
GLUCOSE: 126 mg/dL — AB (ref 70–99)
Potassium: 3.7 mEq/L (ref 3.7–5.3)
SODIUM: 134 meq/L — AB (ref 137–147)
TOTAL PROTEIN: 6.9 g/dL (ref 6.0–8.3)
Total Bilirubin: 0.5 mg/dL (ref 0.3–1.2)

## 2014-03-12 LAB — PROTIME-INR
INR: 1.09 (ref 0.00–1.49)
Prothrombin Time: 13.9 seconds (ref 11.6–15.2)

## 2014-03-12 LAB — MRSA PCR SCREENING: MRSA by PCR: NEGATIVE

## 2014-03-12 LAB — CBC
HCT: 40 % (ref 39.0–52.0)
Hemoglobin: 13.5 g/dL (ref 13.0–17.0)
MCH: 28.5 pg (ref 26.0–34.0)
MCHC: 33.8 g/dL (ref 30.0–36.0)
MCV: 84.6 fL (ref 78.0–100.0)
Platelets: 170 10*3/uL (ref 150–400)
RBC: 4.73 MIL/uL (ref 4.22–5.81)
RDW: 14.3 % (ref 11.5–15.5)
WBC: 9 10*3/uL (ref 4.0–10.5)

## 2014-03-12 MED ORDER — ONDANSETRON HCL 4 MG PO TABS: 4.0000 mg | ORAL_TABLET | Freq: Four times a day (QID) | ORAL | Status: DC | PRN

## 2014-03-12 MED ORDER — METOPROLOL TARTRATE 25 MG PO TABS
25.0000 mg | ORAL_TABLET | Freq: Two times a day (BID) | ORAL | Status: DC
  Administered 2014-03-12 – 2014-03-13 (×4): 25 mg via ORAL
  Filled 2014-03-12 (×5): qty 1

## 2014-03-12 MED ORDER — LISINOPRIL 40 MG PO TABS
40.0000 mg | ORAL_TABLET | Freq: Two times a day (BID) | ORAL | Status: DC
  Administered 2014-03-12 – 2014-03-13 (×4): 40 mg via ORAL
  Filled 2014-03-12 (×5): qty 1

## 2014-03-12 MED ORDER — ENOXAPARIN SODIUM 40 MG/0.4ML ~~LOC~~ SOLN: 40.0000 mg | SUBCUTANEOUS | Status: DC

## 2014-03-12 MED ORDER — ACETAMINOPHEN 325 MG PO TABS: 650.0000 mg | ORAL_TABLET | Freq: Four times a day (QID) | ORAL | Status: DC | PRN

## 2014-03-12 MED ORDER — ONDANSETRON HCL 4 MG/2ML IJ SOLN: 4.0000 mg | Freq: Four times a day (QID) | INTRAMUSCULAR | Status: DC | PRN

## 2014-03-12 MED ORDER — HYDROCODONE-ACETAMINOPHEN 5-325 MG PO TABS
1.0000 | ORAL_TABLET | ORAL | Status: DC | PRN
Start: 2014-03-12 — End: 2014-03-13
  Administered 2014-03-12 – 2014-03-13 (×2): 1 via ORAL
  Filled 2014-03-12 (×2): qty 1

## 2014-03-12 MED ORDER — ENOXAPARIN SODIUM 80 MG/0.8ML ~~LOC~~ SOLN
65.0000 mg | SUBCUTANEOUS | Status: DC
  Administered 2014-03-12 – 2014-03-13 (×2): 65 mg via SUBCUTANEOUS
  Filled 2014-03-12 (×2): qty 0.8

## 2014-03-12 MED ORDER — ACETAMINOPHEN 650 MG RE SUPP: 650.0000 mg | Freq: Four times a day (QID) | RECTAL | Status: DC | PRN

## 2014-03-12 MED ORDER — VANCOMYCIN HCL 10 G IV SOLR
1250.0000 mg | Freq: Two times a day (BID) | INTRAVENOUS | Status: DC
  Administered 2014-03-12 – 2014-03-13 (×2): 1250 mg via INTRAVENOUS
  Filled 2014-03-12 (×3): qty 1250

## 2014-03-12 MED ORDER — AMLODIPINE BESYLATE 10 MG PO TABS
10.0000 mg | ORAL_TABLET | Freq: Two times a day (BID) | ORAL | Status: DC
  Administered 2014-03-12 – 2014-03-13 (×4): 10 mg via ORAL
  Filled 2014-03-12 (×5): qty 1

## 2014-03-12 MED ORDER — SODIUM CHLORIDE 0.9 % IV SOLN
INTRAVENOUS | Status: DC
  Administered 2014-03-12 (×2): via INTRAVENOUS

## 2014-03-12 MED ORDER — VANCOMYCIN HCL 10 G IV SOLR
1500.0000 mg | Freq: Once | INTRAVENOUS | Status: AC
  Administered 2014-03-12: 1500 mg via INTRAVENOUS
  Filled 2014-03-12: qty 1500

## 2014-03-12 NOTE — Progress Notes (Signed)
Nutrition Brief Note  Patient identified on the Malnutrition Screening Tool (MST) Report  Wt Readings from Last 15 Encounters:  03/11/14 286 lb (129.729 kg)  05/24/07 287 lb (130.182 kg)  03/01/07 291 lb 3.2 oz (132.087 kg)    Body mass index is 36.7 kg/(m^2). 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   Current diet order is Low Sodium, patient is consuming approximately 75% of meals at this time. Labs and medications reviewed.   Pt reported an unintentional wt loss of 15 lbs over two months. Denied any changes in appetite/PO intake. Pt interested in adapting healthier diet for additional weight loss; however pt appeared apphrehensive to initiate necessary diet changes after RD reviewed weight loss basics. Provided pt with "General Healthy Nutrition" and "Weight Loss Tips" handouts from the Academy of [employer].  No nutrition interventions warranted at this time. If nutrition issues arise, please consult RD.   Atlee Abide MS RD LDN Clinical Dietitian [phone]

## 2014-03-12 NOTE — Care Management Note (Signed)
CARE MANAGEMENT NOTE 03/12/2014  Patient:  [name], [name]   Account Number:  [id number]  Date Initiated:  03/12/2014  Documentation initiated by:  Joanette Silveria  Subjective/Objective Assessment:   53 yo male admitted with cellulitis.     Action/Plan:   Home when stable   Anticipated DC Date:     Anticipated DC Plan:  [place]  CM consult      Choice offered to / List presented to:  NA   DME arranged  NA      DME agency  NA     [place] arranged  NA      [place] agency  NA   Status of service:  Completed, signed off Medicare Important Message given?   (If response is "NO", the following Medicare IM given date fields will be blank) Date Medicare IM given:   Date Additional Medicare IM given:    Discharge Disposition:    Per UR Regulation:  Reviewed for med. necessity/level of care/duration of stay  If discussed at [place] of Stay Meetings, dates discussed:    Comments:  03/12/14 [place] Chart reviewed for utilization of services. No needs assessed at this time.

## 2014-03-12 NOTE — Progress Notes (Signed)
Progress Note   [name] UVO:[id number] DOB: June 16, 1961 DOA: 03/11/2014 PCP: Estanislado Emms, MD   Brief Narrative:   [name] is an 53 y.o. 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 He was originally evaluated in ED on 03/09/14 and sent home on clindamycin, but returned with worsening symptoms.  Assessment/Plan:   Principal Problem:   Sepsis secondary to Cellulitis, abdominal wall  Patient had a documented fever of 101.6, and has been tachycardic/tachypneic, meeting criteria for sepsis.  Continue IV vancomycin.  Followup blood cultures.  CT of the abdomen did not show any discrete abscess.  Send cultures from pustule since he failed outpatient therapy with clindamycin, which may help in choosing appropriate oral antibiotics at discharge. Active Problems:   Obesity II   HYPERTENSION, BENIGN  Continue amlodipine, lisinopril, and metoprolol.   h/o left Adrenal cortical adenoma  No active issues.   DVT Prophylaxis  Continue Lovenox.  Code Status: Full. Family Communication: No family at bedside. Disposition Plan: Home when stable.   IV Access:    Peripheral IV   Procedures:    None.   Medical Consultants:    None.   Other Consultants:    None.   Anti-Infectives:    Vancomycin 03/11/14--->  Subjective:   [name] tells me he had fevers prior to admission and that his abdomen is a bit sore, but feels better today. No nausea or vomiting.  Objective:    Filed Vitals:   03/11/14 2028 03/11/14 2300 03/11/14 2344 03/12/14 0500  BP: 134/83 139/85 118/59 135/76  Pulse: 90 78 76 70  Temp: 98.3 F (36.8 C) 100 F (37.8 C) 99.8 F (37.7 C) 99.3 F (37.4 C)  TempSrc: Oral Oral Oral Oral  Resp:  18 18 20   Height:   6\' 2"  (1.88 m)   Weight:   129.729 kg (286 lb)   SpO2: 96% 95% 99%  96%    Intake/Output Summary (Last 24 hours) at 03/12/14 0955 Last data filed at 03/12/14 0400  Gross per 24 hour  Intake 263.75 ml  Output      0 ml  Net 263.75 ml    Exam: Gen:  NAD Cardiovascular:  RRR, No M/R/G Respiratory:  Lungs CTAB Gastrointestinal:  Abdomen soft, NT/ND, + BS Extremities:  No C/E/C Skin: See image below, indurated area at site of ruptured pustule with surrounding erythema.      Data Reviewed:    Labs: Basic Metabolic Panel:  Recent Labs Lab 03/10/14 0130 03/11/14 2105 03/12/14 0330  NA 137 137 134*  K 3.7 3.8 3.7  CL 99 100 100  CO2 25 23 22   GLUCOSE 101* 125* 126*  BUN 15 17 15   CREATININE 1.13 1.19 1.08  CALCIUM 9.0 9.2 8.9   GFR Estimated Creatinine Clearance: 114.5 ml/min (by C-G formula based on Cr of 1.08). Liver Function Tests:  Recent Labs Lab 03/12/14 0330  AST 17  ALT 21  ALKPHOS 68  BILITOT 0.5  PROT 6.9  ALBUMIN 3.4*   Coagulation profile  Recent Labs Lab 03/12/14 0330  INR 1.09    CBC:  Recent Labs Lab 03/10/14 0130 03/11/14 2105 03/12/14 0330  WBC 9.8 9.6 9.0  NEUTROABS 7.4 7.3  --   HGB 14.0 14.2 13.5  HCT 40.6 41.8 40.0  MCV 85.1 85.0 84.6  PLT 162 182 170  Sepsis Labs:  Recent Labs Lab 03/10/14 0130 03/11/14 2105 03/11/14 2120 03/12/14 0330  WBC 9.8 9.6  --  9.0  LATICACIDVEN  --   --  1.05  --    Microbiology No results found for this or any previous visit (from the past 240 hour(s)).   Radiographs/Studies:   Ct Abdomen Pelvis W Contrast  03/11/2014   CLINICAL DATA:  Patient returns for follow-up of skin infection on the lower abdomen. The area is read red, swollen, and hot to touch. Cellulitis.  EXAM: CT ABDOMEN AND PELVIS WITH CONTRAST  TECHNIQUE: Multidetector CT imaging of the abdomen and pelvis was performed using the standard protocol following bolus administration of intravenous contrast.  CONTRAST:  19mL OMNIPAQUE IOHEXOL 300 MG/ML  SOLN  COMPARISON:  None.  FINDINGS:  Dependent atelectasis in the lung bases. Small subpleural nodules are demonstrated on the right, largest measuring 3 mm diameter. In a low risk patient, these are likely to be benign.  There is skin thickening and infiltration in the subcutaneous fat along the right lower quadrant lower abdominal wall anteriorly. This is consistent with the history of cellulitis. There is no evidence of any focal fluid collection to suggest abscess.  The liver, spleen, gallbladder, pancreas, adrenal glands, kidneys, abdominal aorta, inferior vena cava, and retroperitoneal lymph nodes are unremarkable. The stomach, small bowel, and colon are decompressed. No free air or free fluid in the abdomen.  Pelvis: The appendix is normal. Prostate gland is enlarged, measuring 5.3 x 4.5 cm. No free or loculated pelvic fluid collections. No significant pelvic lymphadenopathy. No evidence of diverticulitis. Mild degenerative changes in the spine.  IMPRESSION: Changes consistent with cellulitis in the anterior abdominal wall, right lower quadrant. No abscess. Internal organs and bowel are unremarkable.   Electronically Signed   By: Lucienne Capers M.D.   On: 03/11/2014 21:41    Medications:   . amLODipine  10 mg Oral BID  . enoxaparin (LOVENOX) injection  65 mg Subcutaneous Q24H  . lisinopril  40 mg Oral BID  . metoprolol tartrate  25 mg Oral BID  . vancomycin  1,250 mg Intravenous Q12H   Continuous Infusions: . sodium chloride 75 mL/hr at 03/12/14 0029    Time spent: 35 minutes with > 50% of time discussing current diagnostic test results, clinical impression and plan of care.    LOS: 1 day   [place]  [facility] Pager [phone]. If unable to reach me by pager, please call my cell phone at [phone].  *Please refer to [email or web address], password [email or web address] to get updated schedule on who will round on this patient, as hospitalists switch teams weekly. If 7PM-7AM, please contact night-coverage at [email or web address], password  [email or web address] for any overnight needs.  03/12/2014, 9:55 AM    **Disclaimer: This note was dictated with voice recognition software. Similar sounding words can inadvertently be transcribed and this note may contain transcription errors which may not have been corrected upon publication of note.**

## 2014-03-12 NOTE — Progress Notes (Signed)
ANTIBIOTIC CONSULT NOTE - INITIAL  Pharmacy Consult for Vancomycin Indication: Cellulitis  No Known Allergies  Patient Measurements: Height: 6\' 2"  (188 cm) Weight: 286 lb (129.729 kg) IBW/kg (Calculated) : 82.2  Vital Signs: Temp: 99.8 F (37.7 C) (05/10 2344) Temp src: Oral (05/10 2344) BP: 118/59 mmHg (05/10 2344) Pulse Rate: 76 (05/10 2344) Intake/Output from previous day:   Intake/Output from this shift:    Labs:  Recent Labs  03/10/14 0130 03/11/14 2105  WBC 9.8 9.6  HGB 14.0 14.2  PLT 162 182  CREATININE 1.13 1.19   Estimated Creatinine Clearance: 103.9 ml/min (by C-G formula based on Cr of 1.19). No results found for this basename: VANCOTROUGH, VANCOPEAK, VANCORANDOM, GENTTROUGH, GENTPEAK, GENTRANDOM, TOBRATROUGH, TOBRAPEAK, TOBRARND, AMIKACINPEAK, AMIKACINTROU, AMIKACIN,  in the last 72 hours   Microbiology: No results found for this or any previous visit (from the past 720 hour(s)).  Medical History: Past Medical History  Diagnosis Date  . Hypertension     Medications:  Scheduled:  . amLODipine  10 mg Oral BID  . enoxaparin (LOVENOX) injection  65 mg Subcutaneous Q24H  . lisinopril  40 mg Oral BID  . metoprolol tartrate  25 mg Oral BID  . vancomycin  1,500 mg Intravenous Once   Infusions:  . sodium chloride 75 mL/hr at 03/12/14 0029   Assessment:  53 yr male with h/o skin infections.  Complaint of lower abdomen skin infection (has been on clindamycin as outpatient)  Ultrasound shows cellulitis; no abscess noted  Patient received Vancomycin 1gm IV x 1 in ED @ 22:39 on 5/10  Pharmacy consulted to dose Vancomycin for treatment of cellulitis  CrCl (n) ~ 74 ml/min  Blood cultures x 2 ordered   Goal of Therapy:  Vancomycin trough level 10-15 mcg/ml  Plan:  Measure antibiotic drug levels at steady state Follow up culture results  Give an additional Vancomycin dose of 1500mg  IV x 1 now (loading dose total of 2500mg  due to patient weight  of 129.7 kg)  Then continue with Vancomycin 1250mg  IV q12h  Everette Rank, PharmD 03/12/2014,12:52 AM

## 2014-03-13 MED ORDER — CEPHALEXIN 500 MG PO CAPS: 500.0000 mg | ORAL_CAPSULE | Freq: Four times a day (QID) | ORAL | Status: DC

## 2014-03-13 MED ORDER — DOXYCYCLINE HYCLATE 50 MG PO CAPS: 50.0000 mg | ORAL_CAPSULE | Freq: Two times a day (BID) | ORAL | Status: DC

## 2014-03-13 NOTE — Discharge Instructions (Signed)
Abscess °An abscess is an infected area that contains a collection of pus and debris. It can occur in almost any part of the body. An abscess is also known as a furuncle or boil. °CAUSES  °An abscess occurs when tissue gets infected. This can occur from blockage of oil or sweat glands, infection of hair follicles, or a minor injury to the skin. As the body tries to fight the infection, pus collects in the area and creates pressure under the skin. This pressure causes pain. People with weakened immune systems have difficulty fighting infections and get certain abscesses more often.  °SYMPTOMS °Usually an abscess develops on the skin and becomes a painful mass that is red, warm, and tender. If the abscess forms under the skin, you may feel a moveable soft area under the skin. Some abscesses break open (rupture) on their own, but most will continue to get worse without care. The infection can spread deeper into the body and eventually into the bloodstream, causing you to feel ill.  °DIAGNOSIS  °Your caregiver will take your medical history and perform a physical exam. A sample of fluid may also be taken from the abscess to determine what is causing your infection. °TREATMENT  °Your caregiver may prescribe antibiotic medicines to fight the infection. However, taking antibiotics alone usually does not cure an abscess. Your caregiver may need to make a small cut (incision) in the abscess to drain the pus. In some cases, gauze is packed into the abscess to reduce pain and to continue draining the area. °HOME CARE INSTRUCTIONS  °· Only take over-the-counter or prescription medicines for pain, discomfort, or fever as directed by your caregiver. °· If you were prescribed antibiotics, take them as directed. Finish them even if you start to feel better. °· If gauze is used, follow your caregiver's directions for changing the gauze. °· To avoid spreading the infection: °· Keep your draining abscess covered with a  bandage. °· Wash your hands well. °· Do not share personal care items, towels, or whirlpools with others. °· Avoid skin contact with others. °· Keep your skin and clothes clean around the abscess. °· Keep all follow-up appointments as directed by your caregiver. °SEEK MEDICAL CARE IF:  °· You have increased pain, swelling, redness, fluid drainage, or bleeding. °· You have muscle aches, chills, or a general ill feeling. °· You have a fever. °MAKE SURE YOU:  °· Understand these instructions. °· Will watch your condition. °· Will get help right away if you are not doing well or get worse. °Document Released: 07/29/2005 Document Revised: 04/19/2012 Document Reviewed: 01/01/2012 °ExitCare® Patient Information ©2014 [employer]. ° °Cellulitis °Cellulitis is an infection of the skin and the tissue beneath it. The infected area is usually red and tender. Cellulitis occurs most often in the arms and lower legs.  °CAUSES  °Cellulitis is caused by bacteria that enter the skin through cracks or cuts in the skin. The most common types of bacteria that cause cellulitis are Staphylococcus and Streptococcus. °SYMPTOMS  °· Redness and warmth. °· Swelling. °· Tenderness or pain. °· Fever. °DIAGNOSIS  °Your caregiver can usually determine what is wrong based on a physical exam. Blood tests may also be done. °TREATMENT  °Treatment usually involves taking an antibiotic medicine. °HOME CARE INSTRUCTIONS  °· Take your antibiotics as directed. Finish them even if you start to feel better. °· Keep the infected arm or leg elevated to reduce swelling. °· Apply a warm cloth to the affected area up to   4 times per day to relieve pain. °· Only take over-the-counter or prescription medicines for pain, discomfort, or fever as directed by your caregiver. °· Keep all follow-up appointments as directed by your caregiver. °SEEK MEDICAL CARE IF:  °· You notice red streaks coming from the infected area. °· Your red area gets larger or turns dark in  color. °· Your bone or joint underneath the infected area becomes painful after the skin has healed. °· Your infection returns in the same area or another area. °· You notice a swollen bump in the infected area. °· You develop new symptoms. °SEEK IMMEDIATE MEDICAL CARE IF:  °· You have a fever. °· You feel very sleepy. °· You develop vomiting or diarrhea. °· You have a general ill feeling (malaise) with muscle aches and pains. °MAKE SURE YOU:  °· Understand these instructions. °· Will watch your condition. °· Will get help right away if you are not doing well or get worse. °Document Released: 07/29/2005 Document Revised: 04/19/2012 Document Reviewed: 01/04/2012 °ExitCare® Patient Information ©2014 [employer]. ° °

## 2014-03-13 NOTE — Progress Notes (Signed)
Nursing Discharge Summary  Patient ID: [name] MRN: [id number] DOB/AGE: [birth year] 53 y.o.  Admit date: 03/11/2014 Discharge date: 03/13/2014  Discharged Condition: good  Disposition: 01-Home or Self Care  Follow-up Information   Follow up with [facility]     On 03/22/2014. (Appt is at 5:00pm with Dr. Doreene Burke)    Contact information:   [place] [address] [phone]      Prescriptions Given: Medications called into pharmacy for patient to pick up. Follow up appointments discussed.   Means of Discharge: Patient to drive self home in private vehicle. Stable from AM assessment.   Signed: Buel Ream 03/13/2014, 12:19 PM

## 2014-03-13 NOTE — Discharge Summary (Signed)
Physician Discharge Summary  [name] MPN:[id number] DOB: 09-May-1961 DOA: 03/11/2014  PCP: Estanislado Emms, MD  Admit date: 03/11/2014 Discharge date: 03/13/2014   Recommendations for Outpatient Follow-Up:   1. Patient referred to the [facility] for primary care and for hospital followup of abscess/cellulitis.  Please see picture/appearance of cellulitis documented on progress note 03/12/14 for comparison purposes.   Discharge Diagnosis:   Principal Problem:    Sepsis secondary to Cellulitis, abdominal wall Active Problems:    HYPERTENSION, BENIGN    h/o left Adrenal cortical adenoma   Discharge Condition: Improved.  Diet recommendation: Low sodium, heart healthy.     History of Present Illness:   [name] is an 53 y.o. 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 He was originally evaluated in ED on 03/09/14 and sent home on clindamycin, but returned with worsening symptoms.  Hospital Course by Problem:   Principal Problem:  Sepsis secondary to Cellulitis, abdominal wall  Patient had a documented fever of 101.6, and has been tachycardic/tachypneic, meeting criteria for sepsis.  Continue IV vancomycin. CT of the abdomen, done on admission, negative for abscess.  Followup blood cultures and wound cultures. Active Problems:  Obesity II  HYPERTENSION, BENIGN  Continue amlodipine, lisinopril, and metoprolol. h/o left Adrenal cortical adenoma  No active issues.   Procedures:    None.   Medical Consultants:    None.   Discharge Exam:   Filed Vitals:   03/13/14 0450  BP: 129/68  Pulse: 72  Temp: 98.8 F (37.1 C)  Resp: 20   Filed Vitals:   03/12/14 0500 03/12/14 1506 03/12/14 2101 03/13/14 0450  BP: 135/76 134/79 132/69 129/68  Pulse: 70 70 90 72  Temp: 99.3 F (37.4 C) 98.8 F (37.1 C) 99.4 F (37.4 C) 98.8  F (37.1 C)  TempSrc: Oral Oral Oral Oral  Resp: 20 20 20 20   Height:      Weight:      SpO2: 96% 95% 95% 95%    Gen:  NAD Cardiovascular:  RRR, No M/R/G Respiratory: Lungs CTAB Gastrointestinal: Abdomen soft, NT/ND with normal active bowel sounds. Extremities: No C/E/C Skin: No significant change from images documented 03/12/14. Ruptured pustule with surrounding erythema.    Discharge Instructions:   Discharge Orders   Future Appointments Provider Department Dept Phone   03/22/2014 5:00 PM Angelica Chessman, MD [facility] [phone]   Future Orders Complete By Expires   Activity as tolerated - No restrictions  As directed    Call MD for:  extreme fatigue  As directed    Call MD for:  severe uncontrolled pain  As directed    Call MD for:  temperature >100.4  As directed    Diet - low sodium heart healthy  As directed    Discharge instructions  As directed        Medication List    STOP taking these medications       clindamycin 300 MG capsule  Commonly known as:  CLEOCIN      TAKE these medications       amLODipine 10 MG tablet  Commonly known as:  NORVASC  Take 10 mg by mouth 2 (two) times daily.     cephALEXin 500 MG capsule  Commonly known as:  KEFLEX  Take 1 capsule (500 mg total) by mouth 4 (four) times  daily.     doxycycline 50 MG capsule  Commonly known as:  VIBRAMYCIN  Take 1 capsule (50 mg total) by mouth 2 (two) times daily.     HYDROcodone-acetaminophen 5-325 MG per tablet  Commonly known as:  NORCO/VICODIN  Take 1 tablet by mouth every 4 (four) hours as needed for moderate pain.     lisinopril 40 MG tablet  Commonly known as:  PRINIVIL,ZESTRIL  Take 40 mg by mouth 2 (two) times daily.     metoprolol tartrate 25 MG tablet  Commonly known as:  LOPRESSOR  Take 25 mg by mouth 2 (two) times daily.     multivitamin with minerals Tabs tablet  Take 1 tablet by mouth daily.           Follow-up Information    Follow up with [facility]     On 03/22/2014. (Appt is at 5:00pm with Dr. Doreene Burke)    Contact information:   [place] [address] [phone]       The results of significant diagnostics from this hospitalization (including imaging, microbiology, ancillary and laboratory) are listed below for reference.     Significant Diagnostic Studies:   Radiographs: Ct Abdomen Pelvis W Contrast  03/11/2014   CLINICAL DATA:  Patient returns for follow-up of skin infection on the lower abdomen. The area is read red, swollen, and hot to touch. Cellulitis.  EXAM: CT ABDOMEN AND PELVIS WITH CONTRAST  TECHNIQUE: Multidetector CT imaging of the abdomen and pelvis was performed using the standard protocol following bolus administration of intravenous contrast.  CONTRAST:  140mL OMNIPAQUE IOHEXOL 300 MG/ML  SOLN  COMPARISON:  None.  FINDINGS: Dependent atelectasis in the lung bases. Small subpleural nodules are demonstrated on the right, largest measuring 3 mm diameter. In a low risk patient, these are likely to be benign.  There is skin thickening and infiltration in the subcutaneous fat along the right lower quadrant lower abdominal wall anteriorly. This is consistent with the history of cellulitis. There is no evidence of any focal fluid collection to suggest abscess.  The liver, spleen, gallbladder, pancreas, adrenal glands, kidneys, abdominal aorta, inferior vena cava, and retroperitoneal lymph nodes are unremarkable. The stomach, small bowel, and colon are decompressed. No free air or free fluid in the abdomen.  Pelvis: The appendix is normal. Prostate gland is enlarged, measuring 5.3 x 4.5 cm. No free or loculated pelvic fluid collections. No significant pelvic lymphadenopathy. No evidence of diverticulitis. Mild degenerative changes in the spine.  IMPRESSION: Changes consistent with cellulitis in the anterior abdominal wall, right lower quadrant. No abscess.  Internal organs and bowel are unremarkable.   Electronically Signed   By: Lucienne Capers M.D.   On: 03/11/2014 21:41    Labs:  Basic Metabolic Panel:  Recent Labs Lab 03/10/14 0130 03/11/14 2105 03/12/14 0330  NA 137 137 134*  K 3.7 3.8 3.7  CL 99 100 100  CO2 25 23 22   GLUCOSE 101* 125* 126*  BUN 15 17 15   CREATININE 1.13 1.19 1.08  CALCIUM 9.0 9.2 8.9   GFR Estimated Creatinine Clearance: 114.5 ml/min (by C-G formula based on Cr of 1.08). Liver Function Tests:  Recent Labs Lab 03/12/14 0330  AST 17  ALT 21  ALKPHOS 68  BILITOT 0.5  PROT 6.9  ALBUMIN 3.4*   Coagulation profile  Recent Labs Lab 03/12/14 0330  INR 1.09    CBC:  Recent Labs Lab 03/10/14 0130 03/11/14 2105 03/12/14 0330  WBC 9.8 9.6 9.0  NEUTROABS 7.4 7.3  --   HGB 14.0 14.2 13.5  HCT 40.6 41.8 40.0  MCV 85.1 85.0 84.6  PLT 162 182 170   Microbiology Recent Results (from the past 240 hour(s))  CULTURE, BLOOD (ROUTINE X 2)     Status: None   Collection Time    03/11/14  9:05 PM      Result Value Ref Range Status   Specimen Description BLOOD RIGHT ANTECUBITAL   Final   Special Requests BOTTLES DRAWN AEROBIC AND ANAEROBIC 10CC   Final   Culture  Setup Time     Final   Value: 03/12/2014 01:08     Performed at [facility]   Culture     Final   Value:        BLOOD CULTURE RECEIVED NO GROWTH TO DATE CULTURE WILL BE HELD FOR 5 DAYS BEFORE ISSUING A FINAL NEGATIVE REPORT     Performed at [facility]   Report Status PENDING   Incomplete  CULTURE, BLOOD (ROUTINE X 2)     Status: None   Collection Time    03/11/14  9:09 PM      Result Value Ref Range Status   Specimen Description BLOOD BLOOD RIGHT FOREARM   Final   Special Requests BOTTLES DRAWN AEROBIC AND ANAEROBIC 3CC   Final   Culture  Setup Time     Final   Value: 03/12/2014 01:08     Performed at [facility]   Culture     Final   Value:        BLOOD CULTURE RECEIVED NO GROWTH TO DATE CULTURE WILL BE  HELD FOR 5 DAYS BEFORE ISSUING A FINAL NEGATIVE REPORT     Performed at [facility]   Report Status PENDING   Incomplete  MRSA PCR SCREENING     Status: None   Collection Time    03/12/14  9:42 AM      Result Value Ref Range Status   MRSA by PCR NEGATIVE  NEGATIVE Final   Comment:            The GeneXpert MRSA Assay (FDA     approved for NASAL specimens     only), is one component of a     comprehensive MRSA colonization     surveillance program. It is not     intended to diagnose MRSA     infection nor to guide or     monitor treatment for     MRSA infections.  WOUND CULTURE     Status: None   Collection Time    03/12/14 10:40 AM      Result Value Ref Range Status   Specimen Description WOUND LOWER ABDOMEN   Final   Special Requests Normal   Final   Gram Stain     Final   Value: RARE WBC PRESENT,BOTH PMN AND MONONUCLEAR     NO SQUAMOUS EPITHELIAL CELLS SEEN     ABUNDANT GRAM POSITIVE COCCI IN PAIRS     Performed at [facility]   Culture PENDING   Incomplete   Report Status PENDING   Incomplete   Time coordinating discharge: 35 minutes.  SignedVenetia Maxon Rama  Pager [phone] [facility] 03/13/2014, 11:23 AM

## 2014-03-14 LAB — WOUND CULTURE: SPECIAL REQUESTS: NORMAL

## 2014-03-14 NOTE — ED Provider Notes (Signed)
Medical screening examination/treatment/procedure(s) were conducted as a shared visit with non-physician practitioner(s) and myself.  I personally evaluated the patient during the encounter.   EKG Interpretation None      Pt examined.  History reviewed with patient.  Increasing abdominal wall cellulitis over several days.  Exam with indurated, non fluctuent Lower Abd Wall.  U/S by me shows no fluid collection.  CT, IV antibiotics, admission recommended.   Tanna Furry, MD 03/14/14 [phone]

## 2014-03-16 ENCOUNTER — Emergency Department (HOSPITAL_COMMUNITY)
Admission: EM | Admit: 2014-03-16 | Discharge: 2014-03-16 | Disposition: A | Discharge status: Home / Self Care | Payer: BC Managed Care – PPO | Attending: Emergency Medicine | Admitting: Emergency Medicine

## 2014-03-16 ENCOUNTER — Encounter (HOSPITAL_COMMUNITY): Payer: Self-pay | Admitting: Emergency Medicine

## 2014-03-16 DIAGNOSIS — Z79899 Other long term (current) drug therapy: Secondary | ICD-10-CM | POA: Insufficient documentation

## 2014-03-16 DIAGNOSIS — I10 Essential (primary) hypertension: Secondary | ICD-10-CM | POA: Insufficient documentation

## 2014-03-16 DIAGNOSIS — T783XXA Angioneurotic edema, initial encounter: Secondary | ICD-10-CM | POA: Insufficient documentation

## 2014-03-16 DIAGNOSIS — Z792 Long term (current) use of antibiotics: Secondary | ICD-10-CM | POA: Insufficient documentation

## 2014-03-16 DIAGNOSIS — F17200 Nicotine dependence, unspecified, uncomplicated: Secondary | ICD-10-CM | POA: Insufficient documentation

## 2014-03-16 MED ORDER — CLINDAMYCIN HCL 150 MG PO CAPS: 300.0000 mg | ORAL_CAPSULE | Freq: Three times a day (TID) | ORAL | Status: DC

## 2014-03-16 MED ORDER — DIPHENHYDRAMINE HCL 25 MG PO CAPS
50.0000 mg | ORAL_CAPSULE | Freq: Once | ORAL | Status: AC
  Administered 2014-03-16: 50 mg via ORAL
  Filled 2014-03-16: qty 2

## 2014-03-16 NOTE — ED Provider Notes (Signed)
CSN: [id number]     Arrival date & time 03/16/14  7035 History   First MD Initiated Contact with Patient 03/16/14 [phone]     Chief Complaint  Patient presents with  . Angioedema     (Consider location/radiation/quality/duration/timing/severity/associated sxs/prior Treatment) HPI Comments: Patient noticed about 4:00 yesterday afternoon, lip swelling, but has progressively gotten worse over the last 12 hours.  He has not taken any medication for the swelling.  Denies any tongue involvement, shortness of breath, difficulty swallowing.  He has taken lisinopril for the last 15 years for blood pressure control. Patient denies any previous history of swelling of the lips.  He denies food allergies or other medication allergies  The history is provided by the patient.    Past Medical History  Diagnosis Date  . Hypertension    Past Surgical History  Procedure Laterality Date  . Hernia repair      umbilical  . Adrenal gland surgery Left 2012    Removed   No family history on file. History  Substance Use Topics  . Smoking status: Current Some Day Smoker  . Smokeless tobacco: Not on file  . Alcohol Use: Yes     Comment: occcasional    Review of Systems  HENT: Negative for dental problem, sore throat and trouble swallowing.   Respiratory: Negative for shortness of breath and wheezing.   Cardiovascular: Negative for chest pain.  Gastrointestinal: Negative for nausea.  Neurological: Negative for dizziness and headaches.  All other systems reviewed and are negative.     Allergies  Review of patient's allergies indicates no known allergies.  Home Medications   Prior to Admission medications   Medication Sig Start Date End Date Taking? Authorizing Provider  amLODipine (NORVASC) 10 MG tablet Take 10 mg by mouth 2 (two) times daily.      Historical Provider, MD  cephALEXin (KEFLEX) 500 MG capsule Take 1 capsule (500 mg total) by mouth 4 (four) times daily. 03/13/14   Venetia Maxon Rama,  MD  doxycycline (VIBRAMYCIN) 50 MG capsule Take 1 capsule (50 mg total) by mouth 2 (two) times daily. 03/13/14   Venetia Maxon Rama, MD  HYDROcodone-acetaminophen (NORCO/VICODIN) 5-325 MG per tablet Take 1 tablet by mouth every 4 (four) hours as needed for moderate pain. 03/10/14   Arville Lime Schinlever, PA-C  lisinopril (PRINIVIL,ZESTRIL) 40 MG tablet Take 40 mg by mouth 2 (two) times daily.      Historical Provider, MD  metoprolol tartrate (LOPRESSOR) 25 MG tablet Take 25 mg by mouth 2 (two) times daily.    Historical Provider, MD  Multiple Vitamin (MULTIVITAMIN WITH MINERALS) TABS tablet Take 1 tablet by mouth daily.    Historical Provider, MD   BP 118/65  Pulse 80  Temp(Src) 98 F (36.7 C) (Oral)  Resp 18  Ht 6\' 2"  (1.88 m)  Wt 285 lb (129.275 kg)  BMI 36.58 kg/m2  SpO2 95% Physical Exam  Nursing note and vitals reviewed. Constitutional: He is oriented to person, place, and time. He appears well-developed and well-nourished.  HENT:  Head: Normocephalic.  Mouth/Throat: Oropharynx is clear and moist.  Eyes: Pupils are equal, round, and reactive to light.  Neck: Normal range of motion. Neck supple.  Cardiovascular: Normal rate and regular rhythm.   Pulmonary/Chest: Effort normal and breath sounds normal. He has no rales.  Musculoskeletal: Normal range of motion.  Lymphadenopathy:    He has no cervical adenopathy.  Neurological: He is alert and oriented to person, place, and time.  Skin:  Skin is warm. No rash noted.    ED Course  Procedures (including critical care time) Labs Review Labs Reviewed - No data to display  Imaging Review No results found.   EKG Interpretation None      MDM  No change in lip swelling patinet remains comfortable with good oxygen sats will continue to monitor  Final diagnoses:  None         Garald Balding, NP 03/16/14 [phone]

## 2014-03-16 NOTE — ED Provider Notes (Signed)
Medical screening examination/treatment/procedure(s) were conducted as a shared visit with non-physician practitioner(s) and myself.  I personally evaluated the patient during the encounter.   EKG Interpretation None       Teressa Lower, MD 03/16/14 2308

## 2014-03-16 NOTE — ED Notes (Signed)
Bed: WA20 Expected date:  Expected time:  Means of arrival:  Comments: 

## 2014-03-16 NOTE — ED Notes (Signed)
NP at bedside at this time  

## 2014-03-16 NOTE — ED Provider Notes (Signed)
Medical screening examination/treatment/procedure(s) were conducted as a shared visit with non-physician practitioner(s) and myself.  I personally evaluated the patient during the encounter.  S: lip swelling, is on lisinopril, onset yesterday, no h/o same, no trouble breathing O: on exam does have lip angioedema, uvula midline, no stridor or wheezes.  A/P: Angioedema - observed in ED, medications provided, plan stop lisinopril and f/u PCP.   Teressa Lower, MD 03/16/14 [phone]

## 2014-03-16 NOTE — ED Provider Notes (Signed)
6:02 AM Patient signed out to me by Junius Creamer, NP. Patient will be observed for angioedema which is likely due to lisinopril. Patient is currently sleeping. Vitals stable and patient afebrile.   9:27 AM Patient showing improvements in symptoms. Patient will be discharged with instructions to discontinue lisinopril. Patient advised to follow up with PCP. Patient instructed to return with worsening or concerning symptoms.   9:48 AM Patient is concerned the Keflex and Doxycycline caused his reaction. I will change his antibiotics to Clindamycin for his cellulitis.   Alvina Chou, PA-C 03/16/14 [place], PA-C 03/16/14 [phone]

## 2014-03-16 NOTE — ED Notes (Signed)
Pt states that he began to have mild lip swelling around 4pm this afternoon; pt states that in the last few hours that it has gotten progressively worse; pt takes Lisinopril x 85yrs; pt denies difficulty breathing or swallowing at this time.

## 2014-03-16 NOTE — Discharge Instructions (Signed)
Discontinue your lisinopril. This medication likely caused your allergic reaction. Follow up with your doctor for medication change and further evaluation. Return to the ED with worsening or concerning symptoms.

## 2014-03-18 LAB — CULTURE, BLOOD (ROUTINE X 2)
CULTURE: NO GROWTH
Culture: NO GROWTH

## 2014-03-22 ENCOUNTER — Encounter: Payer: Self-pay | Admitting: Internal Medicine

## 2014-03-22 ENCOUNTER — Ambulatory Visit: Payer: BC Managed Care – PPO | Attending: Internal Medicine | Admitting: Internal Medicine

## 2014-03-22 VITALS — BP 126/70 | HR 63 | Temp 97.8°F | Resp 14 | Ht 74.0 in | Wt 287.2 lb

## 2014-03-22 DIAGNOSIS — L03319 Cellulitis of trunk, unspecified: Secondary | ICD-10-CM

## 2014-03-22 DIAGNOSIS — L02211 Cutaneous abscess of abdominal wall: Secondary | ICD-10-CM

## 2014-03-22 DIAGNOSIS — I10 Essential (primary) hypertension: Secondary | ICD-10-CM | POA: Insufficient documentation

## 2014-03-22 DIAGNOSIS — L02219 Cutaneous abscess of trunk, unspecified: Secondary | ICD-10-CM | POA: Insufficient documentation

## 2014-03-22 DIAGNOSIS — F17200 Nicotine dependence, unspecified, uncomplicated: Secondary | ICD-10-CM | POA: Insufficient documentation

## 2014-03-22 LAB — LIPID PANEL
CHOL/HDL RATIO: 6.1 ratio
Cholesterol: 215 mg/dL — ABNORMAL HIGH (ref 0–200)
HDL: 35 mg/dL — AB (ref >39)
LDL CALC: 118 mg/dL — AB (ref 0–99)
Triglycerides: 312 mg/dL — ABNORMAL HIGH (ref <150)
VLDL: 62 mg/dL — AB (ref 0–40)

## 2014-03-22 LAB — POCT GLYCOSYLATED HEMOGLOBIN (HGB A1C): Hemoglobin A1C: 5.3

## 2014-03-22 MED ORDER — LEVOFLOXACIN 750 MG PO TABS: 750.0000 mg | ORAL_TABLET | Freq: Every day | ORAL | Status: DC

## 2014-03-22 NOTE — Progress Notes (Signed)
Patient is here to establish care following hospitalization for cellulitis on abdomen

## 2014-03-22 NOTE — Progress Notes (Signed)
Patient ID: JONAVON TRIEU, male   DOB: Feb 17, 1961, 53 y.o.   MRN: [id number]   [name], is a 53 y.o. male  ERX:[id number]  PJK:[id number]  DOB - January 06, 1961  CC:  Chief Complaint  Patient presents with  . Establish Care       HPI: [name] is a 54 y.o. male here today to establish medical care. Patient has past medical history of hypertension and left adrenalectomy. He was recently seen and admitted to the hospital via ER where he had presented with c/o stomach wall infection. He mentioned that he has noted a boil on his abdomen which he ruptured manually. After that he started having progressive worsening swelling and redness of his abdominal wall. He also started having generalized malaise and chills. He was admitted and treated with IV vancomycin and discharged on Bactrim. Today he feels much better but he still has some drainage. He has no fever. His medications include Norvasc 10 mg tablet by mouth daily and metoprolol 25 mg tablet by mouth twice a day. Patient smokes cigarettes about 1 pack per day and drinks alcohol occasionally. Patient has No headache, No chest pain, No abdominal pain - No Nausea, No new weakness tingling or numbness, No Cough - SOB.  No Known Allergies Past Medical History  Diagnosis Date  . Hypertension    Current Outpatient Prescriptions on File Prior to Visit  Medication Sig Dispense Refill  . amLODipine (NORVASC) 10 MG tablet Take 10 mg by mouth 2 (two) times daily.        . metoprolol tartrate (LOPRESSOR) 25 MG tablet Take 25 mg by mouth 2 (two) times daily.      . Multiple Vitamin (MULTIVITAMIN WITH MINERALS) TABS tablet Take 1 tablet by mouth daily.      Marland Kitchen HYDROcodone-acetaminophen (NORCO/VICODIN) 5-325 MG per tablet Take 1 tablet by mouth every 4 (four) hours as needed for moderate pain.  20 tablet  0   No current facility-administered medications on file prior to visit.   Family History  Problem Relation Age of Onset  . Diabetes  Mother    History   Social History  . Marital Status: Single    Spouse Name: N/A    Number of Children: N/A  . Years of Education: N/A   Occupational History  . Not on file.   Social History Main Topics  . Smoking status: Current Some Day Smoker  . Smokeless tobacco: Not on file  . Alcohol Use: Yes     Comment: occcasional  . Drug Use: No  . Sexual Activity: Not on file   Other Topics Concern  . Not on file   Social History Narrative  . No narrative on file    Review of Systems: Constitutional: Negative for fever, chills, diaphoresis, activity change, appetite change and fatigue. HENT: Negative for ear pain, nosebleeds, congestion, facial swelling, rhinorrhea, neck pain, neck stiffness and ear discharge.  Eyes: Negative for pain, discharge, redness, itching and visual disturbance. Respiratory: Negative for cough, choking, chest tightness, shortness of breath, wheezing and stridor.  Cardiovascular: Negative for chest pain, palpitations and leg swelling. Gastrointestinal: Negative for abdominal distention. Genitourinary: Negative for dysuria, urgency, frequency, hematuria, flank pain, decreased urine volume, difficulty urinating and dyspareunia.  Musculoskeletal: Negative for back pain, joint swelling, arthralgia and gait problem. Neurological: Negative for dizziness, tremors, seizures, syncope, facial asymmetry, speech difficulty, weakness, light-headedness, numbness and headaches.  Hematological: Negative for adenopathy. Does not bruise/bleed easily. Psychiatric/Behavioral: Negative for hallucinations, behavioral problems, confusion, dysphoric  mood, decreased concentration and agitation.    Objective:   Filed Vitals:   03/22/14 1723  BP: 126/70  Pulse: 63  Temp: 97.8 F (36.6 C)  Resp: 14    Physical Exam: Constitutional: Patient appears well-developed and well-nourished. No distress. HENT: Normocephalic, atraumatic, External right and left ear normal.  Oropharynx is clear and moist.  Eyes: Conjunctivae and EOM are normal. PERRLA, no scleral icterus. Neck: Normal ROM. Neck supple. No JVD. No tracheal deviation. No thyromegaly. CVS: RRR, S1/S2 +, no murmurs, no gallops, no carotid bruit.  Pulmonary: Effort and breath sounds normal, no stridor, rhonchi, wheezes, rales.  Abdominal: Soft. BS +, no distension, abdominal wall cellulitis with a minimal drainage  Musculoskeletal: Normal range of motion. No edema and no tenderness.  Lymphadenopathy: No lymphadenopathy noted, cervical, inguinal or axillary Neuro: Alert. Normal reflexes, muscle tone coordination. No cranial nerve deficit. Skin: Skin is warm and dry. No rash noted. Not diaphoretic. No erythema. No pallor. Psychiatric: Normal mood and affect. Behavior, judgment, thought content normal.  Lab Results  Component Value Date   WBC 9.0 03/12/2014   HGB 13.5 03/12/2014   HCT 40.0 03/12/2014   MCV 84.6 03/12/2014   PLT 170 03/12/2014   Lab Results  Component Value Date   CREATININE 1.08 03/12/2014   BUN 15 03/12/2014   NA 134* 03/12/2014   K 3.7 03/12/2014   CL 100 03/12/2014   CO2 22 03/12/2014    No results found for this basename: HGBA1C   Lipid Panel  No results found for this basename: chol, trig, hdl, cholhdl, vldl, ldlcalc       Assessment and plan:   1. HTN (hypertension) Continue amlodipine and metoprolol DASH diet - POCT glycosylated hemoglobin (Hb A1C) - Lipid panel  2. Abscess of skin of abdomen Prescribed - levofloxacin (LEVAQUIN) 750 MG tablet; Take 1 tablet (750 mg total) by mouth daily.  Dispense: 10 tablet; Refill: 0  Patient was extensively counseled on smoking cessation Patient was extensively counseled on nutrition and exercise Return in about 3 months (around 06/22/2014), or if symptoms worsen or fail to improve, for Hemoglobin A1C and Follow up, DM, Follow up HTN.  The patient was given clear instructions to go to ER or return to medical center if symptoms  don't improve, worsen or new problems develop. The patient verbalized understanding. The patient was told to call to get lab results if they haven't heard anything in the next week.     This note has been created with Surveyor, quantity. Any transcriptional errors are unintentional.    Angelica Chessman, MD, [place], [place], [place], [place]   03/22/2014, 5:55 PM

## 2014-03-22 NOTE — Patient Instructions (Signed)
Abscess An abscess is an infected area that contains a collection of pus and debris.It can occur in almost any part of the body. An abscess is also known as a furuncle or boil. CAUSES  An abscess occurs when tissue gets infected. This can occur from blockage of oil or sweat glands, infection of hair follicles, or a minor injury to the skin. As the body tries to fight the infection, pus collects in the area and creates pressure under the skin. This pressure causes pain. People with weakened immune systems have difficulty fighting infections and get certain abscesses more often.  SYMPTOMS Usually an abscess develops on the skin and becomes a painful mass that is red, warm, and tender. If the abscess forms under the skin, you may feel a moveable soft area under the skin. Some abscesses break open (rupture) on their own, but most will continue to get worse without care. The infection can spread deeper into the body and eventually into the bloodstream, causing you to feel ill.  DIAGNOSIS  Your caregiver will take your medical history and perform a physical exam. A sample of fluid may also be taken from the abscess to determine what is causing your infection. TREATMENT  Your caregiver may prescribe antibiotic medicines to fight the infection. However, taking antibiotics alone usually does not cure an abscess. Your caregiver may need to make a small cut (incision) in the abscess to drain the pus. In some cases, gauze is packed into the abscess to reduce pain and to continue draining the area. HOME CARE INSTRUCTIONS   Only take over-the-counter or prescription medicines for pain, discomfort, or fever as directed by your caregiver.  If you were prescribed antibiotics, take them as directed. Finish them even if you start to feel better.  If gauze is used, follow your caregiver's directions for changing the gauze.  To avoid spreading the infection:  Keep your draining abscess covered with a  bandage.  Wash your hands well.  Do not share personal care items, towels, or whirlpools with others.  Avoid skin contact with others.  Keep your skin and clothes clean around the abscess.  Keep all follow-up appointments as directed by your caregiver. SEEK MEDICAL CARE IF:   You have increased pain, swelling, redness, fluid drainage, or bleeding.  You have muscle aches, chills, or a general ill feeling.  You have a fever. MAKE SURE YOU:   Understand these instructions.  Will watch your condition.  Will get help right away if you are not doing well or get worse. Document Released: 07/29/2005 Document Revised: 04/19/2012 Document Reviewed: 01/01/2012 [employer] Patient Information 2014 [employer].  

## 2014-03-23 ENCOUNTER — Telehealth: Payer: Self-pay

## 2014-03-23 MED ORDER — GEMFIBROZIL 600 MG PO TABS: 600.0000 mg | ORAL_TABLET | Freq: Two times a day (BID) | ORAL | Status: DC

## 2014-03-23 NOTE — Telephone Encounter (Signed)
Spoke with patient regarding his lab results Patient will pick up the prescription at pharmacy on file

## 2014-03-23 NOTE — Telephone Encounter (Signed)
Message copied by Dorothe Pea on Fri Mar 23, 2014  4:13 PM ------      Message from: Tresa Garter      Created: Fri Mar 23, 2014  3:52 PM       Please Inform patient that his laboratory studies shows that he is not diabetic, but his cholesterol level is very high. We'll need to start him on medication.            Please call in prescription for gemfibrozil 600 mg tablet by mouth twice a day, one to 2 tablets with 3 refills ------

## 2014-04-30 ENCOUNTER — Other Ambulatory Visit: Payer: Self-pay | Admitting: Gastroenterology

## 2014-05-27 ENCOUNTER — Encounter (HOSPITAL_COMMUNITY): Payer: Self-pay | Admitting: Emergency Medicine

## 2014-05-27 ENCOUNTER — Emergency Department (HOSPITAL_COMMUNITY)
Admission: EM | Admit: 2014-05-27 | Discharge: 2014-05-27 | Disposition: A | Discharge status: Home / Self Care | Payer: BC Managed Care – PPO | Attending: Emergency Medicine | Admitting: Emergency Medicine

## 2014-05-27 DIAGNOSIS — I10 Essential (primary) hypertension: Secondary | ICD-10-CM | POA: Insufficient documentation

## 2014-05-27 DIAGNOSIS — Z79899 Other long term (current) drug therapy: Secondary | ICD-10-CM | POA: Insufficient documentation

## 2014-05-27 DIAGNOSIS — L0291 Cutaneous abscess, unspecified: Secondary | ICD-10-CM

## 2014-05-27 DIAGNOSIS — IMO0002 Reserved for concepts with insufficient information to code with codable children: Secondary | ICD-10-CM | POA: Insufficient documentation

## 2014-05-27 DIAGNOSIS — F17200 Nicotine dependence, unspecified, uncomplicated: Secondary | ICD-10-CM | POA: Insufficient documentation

## 2014-05-27 MED ORDER — SULFAMETHOXAZOLE-TMP DS 800-160 MG PO TABS
1.0000 | ORAL_TABLET | Freq: Two times a day (BID) | ORAL | Status: DC
  Administered 2014-05-27: 1 via ORAL
  Filled 2014-05-27: qty 1

## 2014-05-27 MED ORDER — SULFAMETHOXAZOLE-TRIMETHOPRIM 800-160 MG PO TABS: 1.0000 | ORAL_TABLET | Freq: Two times a day (BID) | ORAL | Status: DC

## 2014-05-27 NOTE — ED Provider Notes (Signed)
Medical screening examination/treatment/procedure(s) were performed by non-physician practitioner and as supervising physician I was immediately available for consultation/collaboration.   EKG Interpretation None      Rolland Porter, MD, [facility]   Janice Norrie, MD 05/27/14 [phone]

## 2014-05-27 NOTE — Discharge Instructions (Signed)
Do not use antiperspirant.  Apply warm compresses to the abscesses that weren't open today.  Differential areas recur under the underarms please follow with surgery.  Do not hesitate to return to the Emergency Department for any new, worsening or concerning symptoms.   If you do not have a primary care doctor you can establish one at the   [facility]: [place] [address] [phone]  After you establish care. Let them know you were seen in the emergency room. They must obtain records for further management.

## 2014-05-27 NOTE — ED Provider Notes (Signed)
CSN: [id number]     Arrival date & time 05/27/14  1230 History   First MD Initiated Contact with Patient 05/27/14 1300    This chart was scribed for non-physician practitioner Monico Blitz, PA-C working with Janice Norrie, MD, by Thea Alken, ED Scribe. This patient was seen in room WTR7/WTR7 and the patient's care was started at 1:04 PM. Chief Complaint  Patient presents with  . Abscess   The history is provided by the patient. No language interpreter was used.   [name] is a 53 y.o. male who presents to the Emergency Department complaining of a painless, draining abscess located to left axilla onset 3 days. Pt reports a boil in the past. He reports taking abx at that time. Pt denies fever, chills, nausea and emesis.    Past Medical History  Diagnosis Date  . Hypertension    Past Surgical History  Procedure Laterality Date  . Hernia repair      umbilical  . Adrenal gland surgery Left 2012    Removed   Family History  Problem Relation Age of Onset  . Diabetes Mother    History  Substance Use Topics  . Smoking status: Current Some Day Smoker  . Smokeless tobacco: Not on file  . Alcohol Use: Yes     Comment: occcasional    Review of Systems  Constitutional: Negative for fever and chills.  Gastrointestinal: Negative for nausea and vomiting.  Skin: Negative for rash and wound.  All other systems reviewed and are negative.  Allergies  Review of patient's allergies indicates no known allergies.  Home Medications   Prior to Admission medications   Medication Sig Start Date End Date Taking? Authorizing Provider  amLODipine (NORVASC) 10 MG tablet Take 10 mg by mouth 2 (two) times daily.     Yes Historical Provider, MD  gemfibrozil (LOPID) 600 MG tablet Take 1 tablet (600 mg total) by mouth 2 (two) times daily before a meal. 03/23/14  Yes Angelica Chessman, MD  metoprolol tartrate (LOPRESSOR) 25 MG tablet Take 25 mg by mouth 2 (two) times daily.   Yes Historical  Provider, MD  Multiple Vitamin (MULTIVITAMIN WITH MINERALS) TABS tablet Take 1 tablet by mouth daily.   Yes Historical Provider, MD  sulfamethoxazole-trimethoprim (SEPTRA DS) 800-160 MG per tablet Take 1 tablet by mouth every 12 (twelve) hours. 05/27/14   Marcea Rojek, PA-C   BP 125/78  Pulse 58  Temp(Src) 97.2 F (36.2 C) (Oral)  Resp 18  SpO2 100% Physical Exam  Nursing note and vitals reviewed. Constitutional: He is oriented to person, place, and time. He appears well-developed and well-nourished. No distress.  HENT:  Head: Normocephalic and atraumatic.  Eyes: Conjunctivae and EOM are normal.  Neck: Neck supple.  Cardiovascular: Normal rate.   Pulmonary/Chest: Effort normal.  Musculoskeletal: Normal range of motion.  Neurological: He is alert and oriented to person, place, and time.  Skin: Skin is warm and dry.  Left axilla -1x3 cm pointing abscess with purulent drainage and no surrounding cellulitis.  Right axilla- 2 small abscess 1cm indurated. not fluctuant with no draiange  Psychiatric: He has a normal mood and affect. His behavior is normal.   ED Course  Procedures (including critical care time)  INCISION AND DRAINAGE Performed by: Monico Blitz Consent: Verbal consent obtained. Risks and benefits: risks, benefits and alternatives were discussed Type: abscess  Body area: Left axilla  Anesthesia: local infiltration  Incision was made with a scalpel.  Local anesthetic: lidocaine 2%  with epinephrine  Anesthetic total: 3 ml  Complexity: complex Blunt dissection to break up loculations  Drainage: purulent  Drainage amount: Moderate   Packing material: None   Patient tolerance: Patient tolerated the procedure well with no immediate complications.    DIAGNOSTIC STUDIES: Oxygen Saturation is 100% on RA, normal by my interpretation.    COORDINATION OF CARE: 1:19 PM- Pt advised of plan for treatment and pt agrees.  Labs Review Labs Reviewed - No  data to display  Imaging Review No results found.   EKG Interpretation None      MDM   Final diagnoses:  Abscess   Filed Vitals:   05/27/14 1235  BP: 125/78  Pulse: 58  Temp: 97.2 F (36.2 C)  TempSrc: Oral  Resp: 18  SpO2: 100%    Medications  sulfamethoxazole-trimethoprim (BACTRIM DS) 800-160 MG per tablet 1 tablet (1 tablet Oral Given 05/27/14 1349)    [name] is a 53 y.o. male presenting with pointing abscess to left axilla, I and D. performed. Patient also has several smaller indurated abscess to right axilla. It may be hidradenitis suprativa. Surgical referral given. Return precautions and wound care discussed.  Evaluation does not show pathology that would require ongoing emergent intervention or inpatient treatment. Pt is hemodynamically stable and mentating appropriately. Discussed findings and plan with patient/guardian, who agrees with care plan. All questions answered. Return precautions discussed and outpatient follow up given.   New Prescriptions   SULFAMETHOXAZOLE-TRIMETHOPRIM (SEPTRA DS) 800-160 MG PER TABLET    Take 1 tablet by mouth every 12 (twelve) hours.       I personally performed the services described in this documentation, which was scribed in my presence. The recorded information has been reviewed and is accurate.      Monico Blitz, PA-C 05/27/14 1355

## 2014-05-27 NOTE — ED Notes (Signed)
Pt reports "lumps" underneath both armpits. Reports the left armpit lumps are oozing. Pain 1/10.

## 2014-06-21 ENCOUNTER — Ambulatory Visit: Payer: BC Managed Care – PPO | Admitting: Internal Medicine

## 2014-06-22 ENCOUNTER — Other Ambulatory Visit: Payer: Self-pay | Admitting: Internal Medicine

## 2014-07-18 ENCOUNTER — Encounter (HOSPITAL_COMMUNITY): Payer: Self-pay | Admitting: Emergency Medicine

## 2014-07-18 ENCOUNTER — Emergency Department (HOSPITAL_COMMUNITY)
Admission: EM | Admit: 2014-07-18 | Discharge: 2014-07-18 | Disposition: A | Discharge status: Home / Self Care | Payer: BC Managed Care – PPO | Attending: Emergency Medicine | Admitting: Emergency Medicine

## 2014-07-18 DIAGNOSIS — F17200 Nicotine dependence, unspecified, uncomplicated: Secondary | ICD-10-CM | POA: Diagnosis not present

## 2014-07-18 DIAGNOSIS — L089 Local infection of the skin and subcutaneous tissue, unspecified: Secondary | ICD-10-CM | POA: Diagnosis not present

## 2014-07-18 DIAGNOSIS — I10 Essential (primary) hypertension: Secondary | ICD-10-CM | POA: Diagnosis not present

## 2014-07-18 DIAGNOSIS — Z79899 Other long term (current) drug therapy: Secondary | ICD-10-CM | POA: Insufficient documentation

## 2014-07-18 MED ORDER — SULFAMETHOXAZOLE-TRIMETHOPRIM 800-160 MG PO TABS: 1.0000 | ORAL_TABLET | Freq: Two times a day (BID) | ORAL | Status: DC

## 2014-07-18 NOTE — ED Notes (Signed)
Pt states he has had a boil forming on his neck for the past week. Pt states he is usually given antibiotics to clear up the infection.

## 2014-07-18 NOTE — ED Provider Notes (Signed)
CSN: [id number]     Arrival date & time 07/18/14  1620 History  This chart was scribed for non-physician practitioner, Margarita Mail, PA-C working with Jasper Riling. Alvino Chapel, MD by Frederich Balding, ED scribe. This patient was seen in room WTR5/WTR5 and the patient's care was started at 5:12 PM.   Chief Complaint  Patient presents with  . Recurrent Skin Infections   The history is provided by the patient. No language interpreter was used.   HPI Comments: [name] is a 53 y.o. male who presents to the Emergency Department complaining of an abscess under his chin that started one week ago. Reports worsening swelling around the area but states there is only mild pain. He has had abscesses to the same area in the past and states he is normally given antibiotics to clear it up. Denies trouble swallowing, difficulty breathing.   Past Medical History  Diagnosis Date  . Hypertension    Past Surgical History  Procedure Laterality Date  . Hernia repair      umbilical  . Adrenal gland surgery Left 2012    Removed   Family History  Problem Relation Age of Onset  . Diabetes Mother    History  Substance Use Topics  . Smoking status: Current Some Day Smoker  . Smokeless tobacco: Not on file  . Alcohol Use: Yes     Comment: occcasional    Review of Systems  Constitutional: Negative for fever.  HENT: Negative for trouble swallowing.   Eyes: Negative for redness.  Respiratory: Negative for shortness of breath.   Cardiovascular: Negative for chest pain.  Gastrointestinal: Negative for abdominal distention.  Musculoskeletal: Negative for gait problem.  Skin:       Abscess  Neurological: Negative for speech difficulty.  Psychiatric/Behavioral: Negative for confusion.   Allergies  Review of patient's allergies indicates no known allergies.  Home Medications   Prior to Admission medications   Medication Sig Start Date End Date Taking? Authorizing Provider  amLODipine (NORVASC) 10  MG tablet Take 10 mg by mouth 2 (two) times daily.      Historical Provider, MD  gemfibrozil (LOPID) 600 MG tablet TAKE 1 TABLET BY MOUTH TWICE DAILY BEFORE A MEAL 06/22/14   Tresa Garter, MD  metoprolol tartrate (LOPRESSOR) 25 MG tablet Take 25 mg by mouth 2 (two) times daily.    Historical Provider, MD  Multiple Vitamin (MULTIVITAMIN WITH MINERALS) TABS tablet Take 1 tablet by mouth daily.    Historical Provider, MD  sulfamethoxazole-trimethoprim (SEPTRA DS) 800-160 MG per tablet Take 1 tablet by mouth every 12 (twelve) hours. 05/27/14   Nicole Pisciotta, PA-C   BP 130/90  Pulse 70  Temp(Src) 98.2 F (36.8 C) (Oral)  Resp 18  SpO2 96%  Physical Exam  Nursing note and vitals reviewed. Constitutional: He is oriented to person, place, and time. He appears well-developed and well-nourished. No distress.  HENT:  Head: Normocephalic and atraumatic.  Mouth/Throat: Oropharynx is clear and moist and mucous membranes are normal.  No signs of gingival erythema or infection.  Eyes: Conjunctivae are normal. No scleral icterus.  Neck: Normal range of motion.  Cardiovascular: Normal rate, regular rhythm and intact distal pulses.   Pulmonary/Chest: Effort normal and breath sounds normal.  No stridor  Abdominal: Soft. He exhibits no distension. There is no tenderness.  Lymphadenopathy:    He has no cervical adenopathy.  Neurological: He is alert and oriented to person, place, and time.  Skin: Skin is warm and dry.  He is not diaphoretic. There is erythema.  3 x 2 cm warm area of induration just under right mandible. No central fluctuance.   Psychiatric: He has a normal mood and affect.    ED Course  Procedures (including critical care time)  DIAGNOSTIC STUDIES: Oxygen Saturation is 96% on RA, normal by my interpretation.    COORDINATION OF CARE: 5:14 PM-Discussed treatment plan which includes an antibiotic with pt at bedside and pt agreed to plan. Return precautions given.   Labs  Review Labs Reviewed - No data to display  Imaging Review No results found.   EKG Interpretation None      MDM   Final diagnoses:  Skin infection    Indurated area under the patient's chin.No airway compromise. Not drainable.  D/c with clindamycin.  I personally performed the services described in this documentation, which was scribed in my presence. The recorded information has been reviewed and is accurate.  Margarita Mail, PA-C 07/21/14 [phone]

## 2014-07-18 NOTE — Discharge Instructions (Signed)
Facial Infection °You have an infection of your face. This requires special attention to help prevent serious problems. Infections in facial wounds can cause poor healing and scars. They can also spread to deeper tissues, especially around the eye. Wound and dental infections can lead to sinusitis, infection of the eye socket, and even meningitis. Permanent damage to the skin, eye, and nervous system may result if facial infections are not treated properly. With severe infections, hospital care for IV antibiotic injections may be needed if they don't respond to oral antibiotics. °Antibiotics must be taken for the full course to insure the infection is eliminated. If the infection came from a bad tooth, it may have to be extracted when the infection is under control. Warm compresses may be applied to reduce skin irritation and remove drainage. °You might need a tetanus shot now if: °· You cannot remember when your last tetanus shot was. °· You have never had a tetanus shot. °· The object that caused your wound was dirty. °If you need a tetanus shot, and you decide not to get one, there is a rare chance of getting tetanus. Sickness from tetanus can be serious. If you got a tetanus shot, your arm may swell, get red and warm to the touch at the shot site. This is common and not a problem. °SEEK IMMEDIATE MEDICAL CARE IF:  °· You have increased swelling, redness, or trouble breathing. °· You have a severe headache, dizziness, nausea, or vomiting. °· You develop problems with your eyesight. °· You have a fever. °Document Released: 11/26/2004 Document Revised: 01/11/2012 Document Reviewed: 10/19/2005 °ExitCare® Patient Information ©2015 [employer]. This information is not intended to replace advice given to you by your health care provider. Make sure you discuss any questions you have with your health care provider. ° °

## 2014-07-22 NOTE — ED Provider Notes (Signed)
Medical screening examination/treatment/procedure(s) were performed by non-physician practitioner and as supervising physician I was immediately available for consultation/collaboration.   EKG Interpretation None       Jasper Riling. Alvino Chapel, MD 07/22/14 [phone]

## 2014-07-29 ENCOUNTER — Other Ambulatory Visit: Payer: Self-pay | Admitting: Internal Medicine

## 2014-08-01 ENCOUNTER — Other Ambulatory Visit: Payer: Self-pay | Admitting: Emergency Medicine

## 2014-08-01 MED ORDER — GEMFIBROZIL 600 MG PO TABS
600.0000 mg | ORAL_TABLET | Freq: Two times a day (BID) | ORAL | Status: AC
Start: 2014-08-01 — End: ?

## 2014-09-01 IMAGING — CT CT ABD-PELV W/ CM
1 of 3 series · 14 of 32 positions shown, 19 images · IV contrast (OMNIPAQUE 300)
Comparison: None.

CLINICAL DATA: Patient returns for follow-up of skin infection on
the lower abdomen. The area is read red, swollen, and hot to touch.
Cellulitis.

EXAM:
CT ABDOMEN AND PELVIS WITH CONTRAST
TECHNIQUE: Multidetector CT imaging of the abdomen and pelvis was performed
using the standard protocol following bolus administration of
intravenous contrast.
CONTRAST:  100mL OMNIPAQUE IOHEXOL 300 MG/ML  SOLN

[Series 2: abd/pel with · axial · 0.85mm/px · z∈[-188,+277]mm · 14 of 105 slices shown, 19 images]
[im 6/105  soft-tissue]
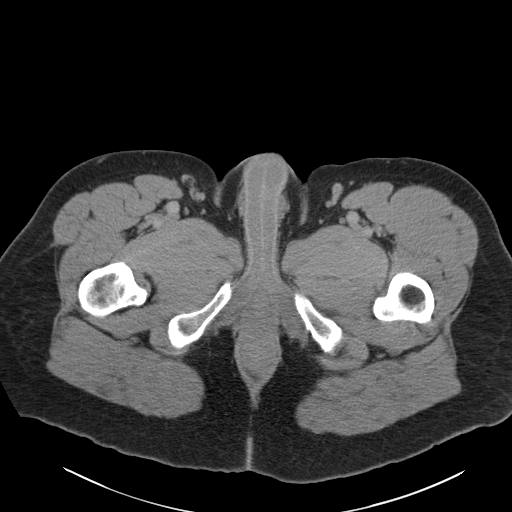
[im 6/105  bone]
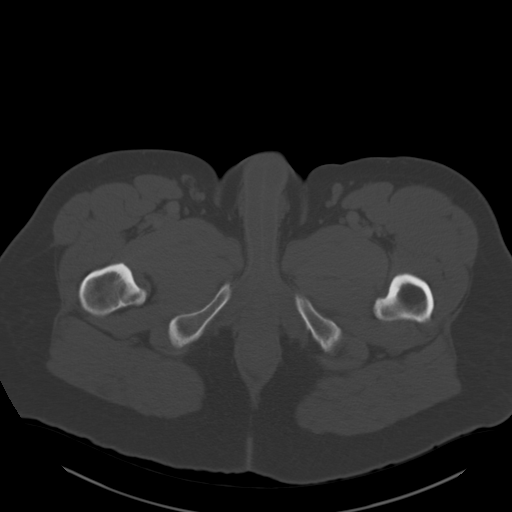
[im 17/105  soft-tissue]
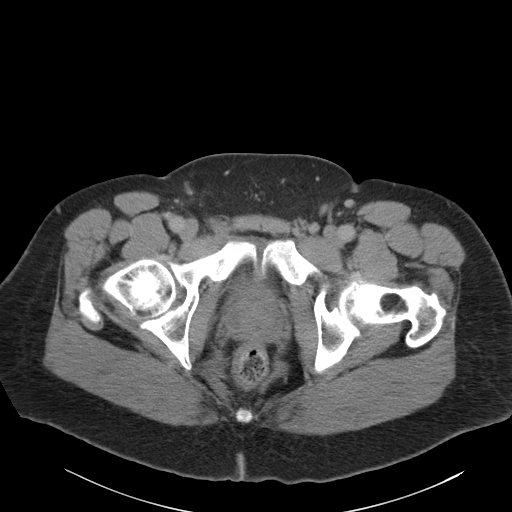
[im 22/105  soft-tissue]
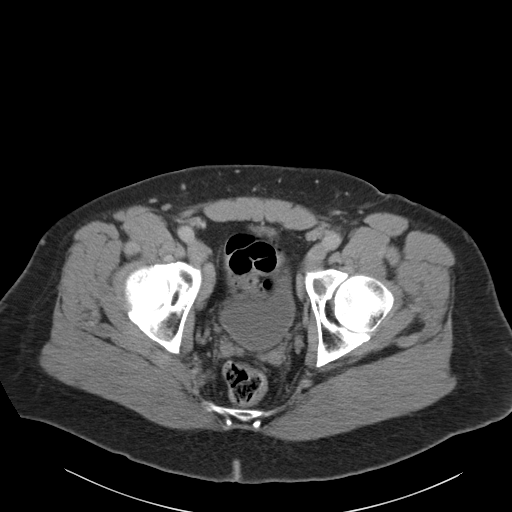
[im 28/105  soft-tissue]
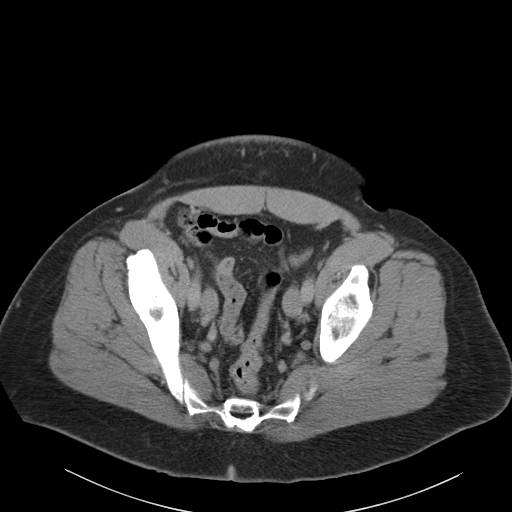
[im 39/105  soft-tissue]
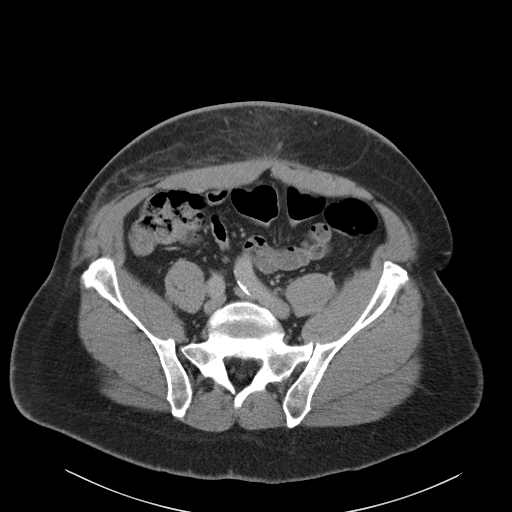
[im 44/105  soft-tissue]
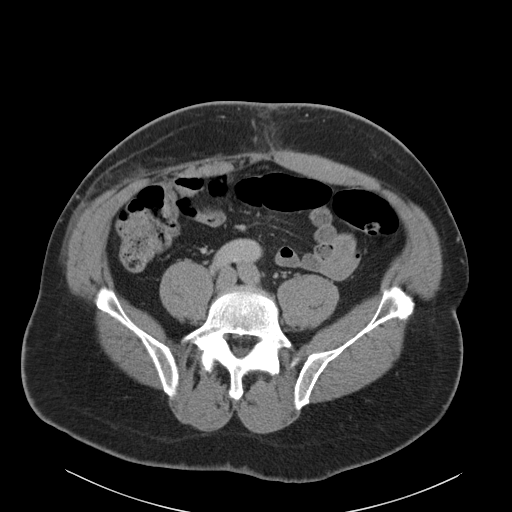
[im 55/105  soft-tissue]
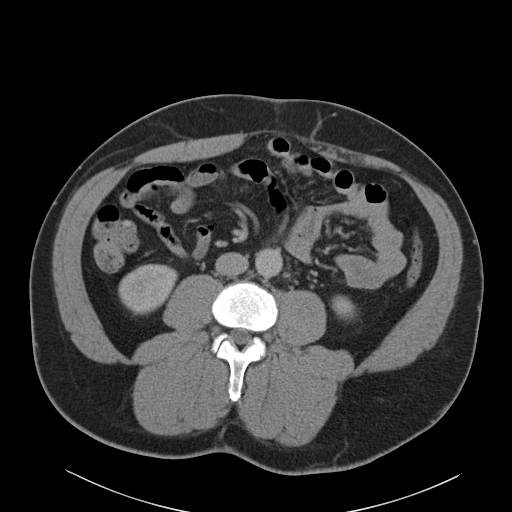
[im 61/105  soft-tissue]
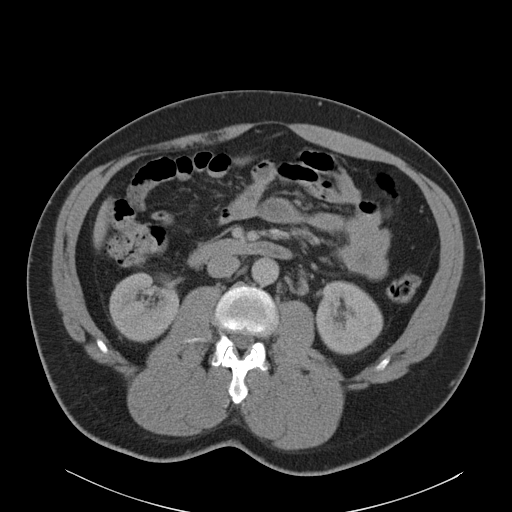
[im 66/105  soft-tissue]
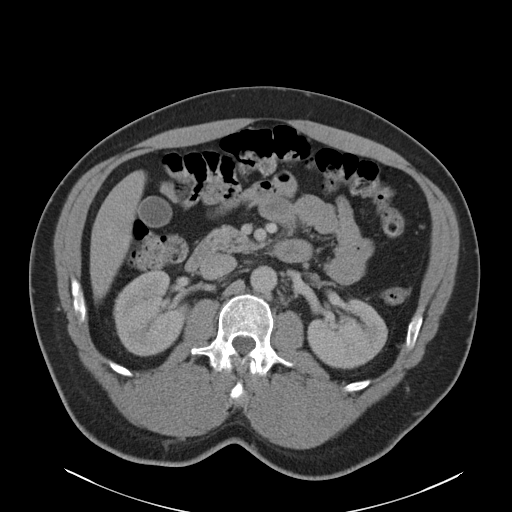
[im 66/105  bone]
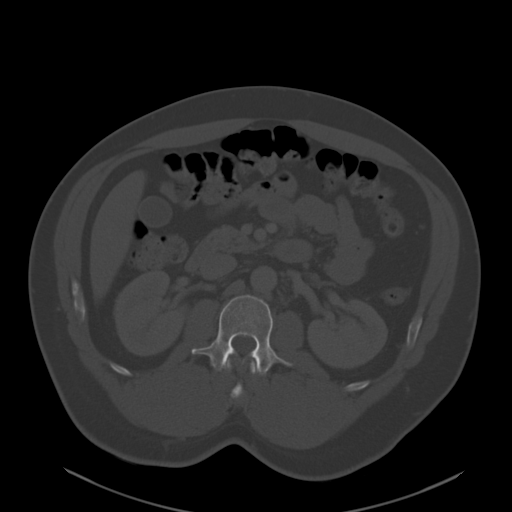
[im 77/105  soft-tissue]
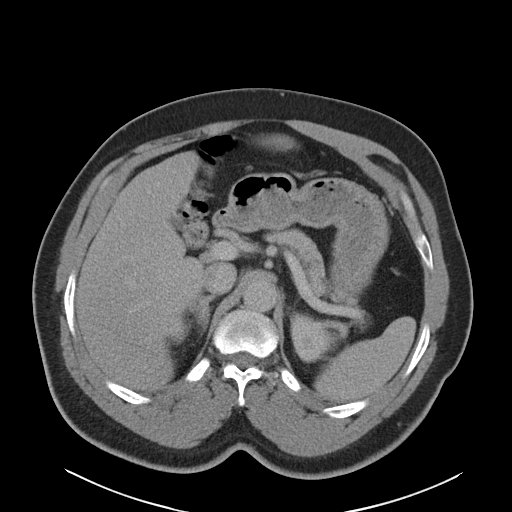
[im 83/105  soft-tissue]
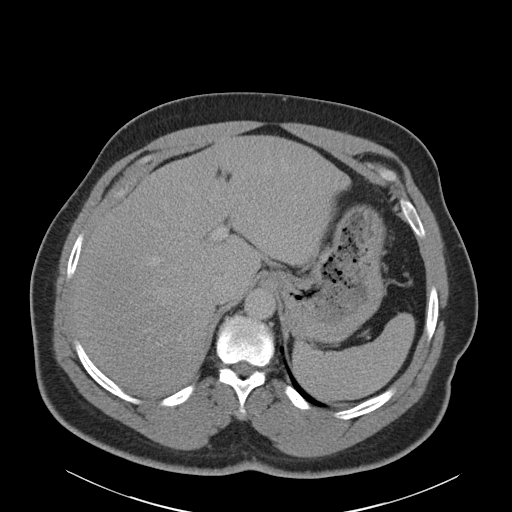
[im 83/105  lung]
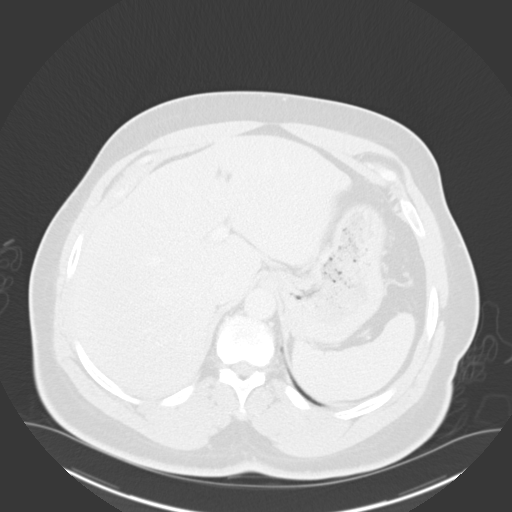
[im 88/105  soft-tissue]
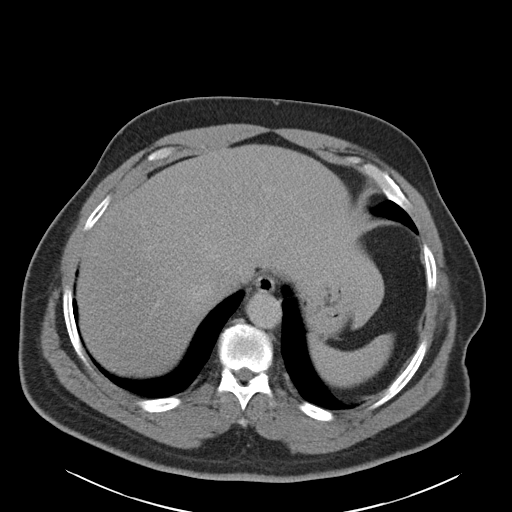
[im 88/105  lung]
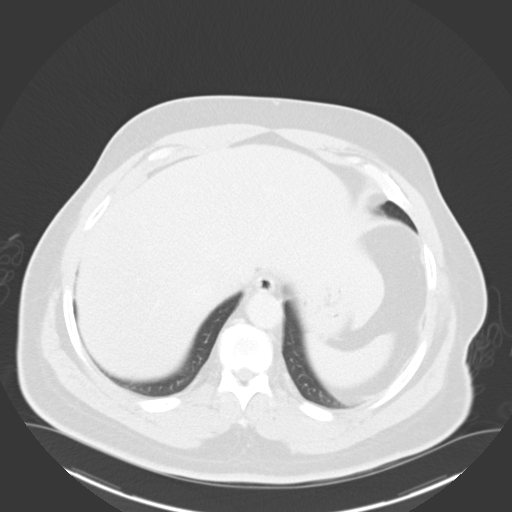
[im 94/105  lung]
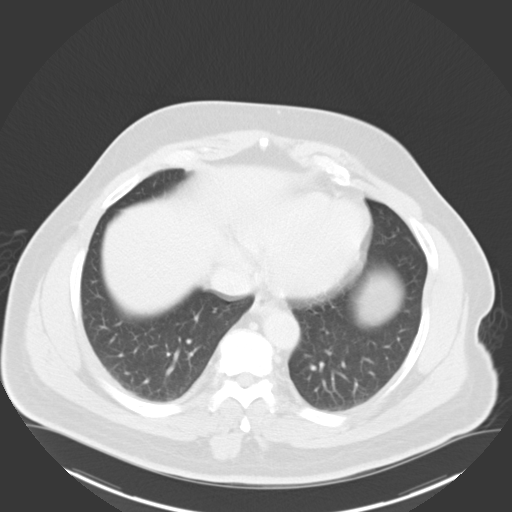
[im 99/105  soft-tissue]
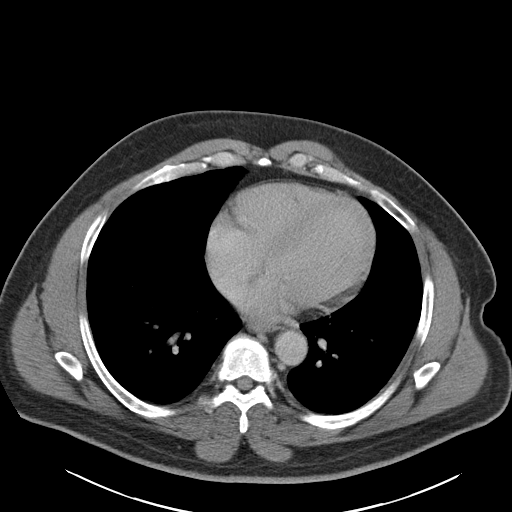
[im 99/105  lung]
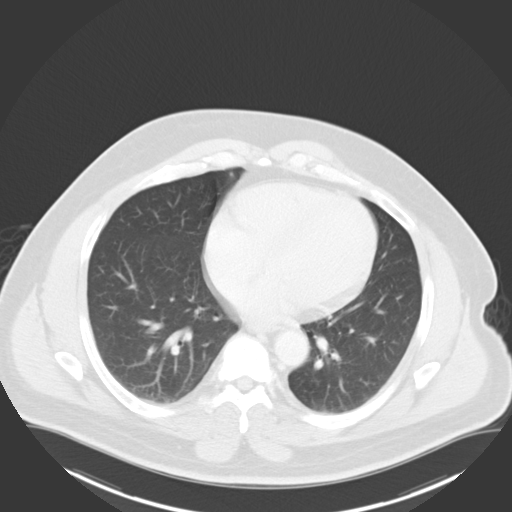

[14 of 32 positions shown; findings below may reference images not displayed]

FINDINGS: Dependent atelectasis in the lung bases. Small subpleural nodules
are demonstrated on the right, largest measuring 3 mm diameter. In a
low risk patient, these are likely to be benign.

There is skin thickening and infiltration in the subcutaneous fat
along the right lower quadrant lower abdominal wall anteriorly. This
is consistent with the history of cellulitis. There is no evidence
of any focal fluid collection to suggest abscess.

The liver, spleen, gallbladder, pancreas, adrenal glands, kidneys,
abdominal aorta, inferior vena cava, and retroperitoneal lymph nodes
are unremarkable. The stomach, small bowel, and colon are
decompressed. No free air or free fluid in the abdomen.

Pelvis: The appendix is normal. Prostate gland is enlarged,
measuring 5.3 x 4.5 cm. No free or loculated pelvic fluid
collections. No significant pelvic lymphadenopathy. No evidence of
diverticulitis. Mild degenerative changes in the spine.
IMPRESSION: Changes consistent with cellulitis in the anterior abdominal wall,
right lower quadrant. No abscess. Internal organs and bowel are
unremarkable.

## 2015-03-12 ENCOUNTER — Encounter: Payer: Self-pay | Admitting: Podiatry

## 2015-03-12 ENCOUNTER — Ambulatory Visit (INDEPENDENT_AMBULATORY_CARE_PROVIDER_SITE_OTHER): Payer: BLUE CROSS/BLUE SHIELD | Admitting: Podiatry

## 2015-03-12 VITALS — BP 151/85 | HR 53 | Resp 15

## 2015-03-12 DIAGNOSIS — B351 Tinea unguium: Secondary | ICD-10-CM

## 2015-03-12 MED ORDER — TERBINAFINE HCL 250 MG PO TABS: 250.0000 mg | ORAL_TABLET | Freq: Every day | ORAL | Status: DC

## 2015-03-12 NOTE — Progress Notes (Signed)
   Subjective:    Patient ID: [name], male    DOB: 1961/04/17, 54 y.o.   MRN: [id number]  HPI Pt presents with bilateral thickened discolored nails, has tried otc topical with no results  Review of Systems  All other systems reviewed and are negative.      Objective:   Physical Exam        Assessment & Plan:

## 2015-03-13 NOTE — Progress Notes (Signed)
Subjective:     Patient ID: [name], male   DOB: 1961/07/28, 54 y.o.   MRN: [id number]  HPI patient presents with severe thickness of nailbeds 1-5 both feet with dystrophic changes noted and yellow type crumbled discoloration with mild discomfort   Review of Systems  All other systems reviewed and are negative.      Objective:   Physical Exam  Constitutional: He is oriented to person, place, and time.  Cardiovascular: Intact distal pulses.   Musculoskeletal: Normal range of motion.  Neurological: He is oriented to person, place, and time.  Skin: Skin is warm.  Nursing note and vitals reviewed.  neurovascular status intact with muscle strength adequate range of motion within normal limits. Patient's noted to have thick yellow brittle nailbeds 1-5 both feet that are crumbled and very difficult to cut with incurvation of the nailbeds noted. Patient has also dry skin which is probably related to this and is found to have good digital perfusion and is well oriented 3     Assessment:     Adequate nail infection 1-5 both feet with probable systemic implications and skin manifestations    Plan:     H&P and condition reviewed with patient with different treatment options reviewed x-ray length. At this time we are going with oral treatment and patient will be placed on Lamisil daily for 90 days and I explained risk of this and we will order blood work consisting of liver function studies which she will complete today. Also went ahead and start topical medication and reviewed other treatment processes

## 2015-03-16 LAB — HEPATIC FUNCTION PANEL
ALT: 23 U/L (ref 0–53)
AST: 17 U/L (ref 0–37)
Albumin: 4 g/dL (ref 3.5–5.2)
Alkaline Phosphatase: 48 U/L (ref 39–117)
BILIRUBIN DIRECT: 0.1 mg/dL (ref 0.0–0.3)
BILIRUBIN TOTAL: 0.5 mg/dL (ref 0.2–1.2)
Indirect Bilirubin: 0.4 mg/dL (ref 0.2–1.2)
TOTAL PROTEIN: 6.9 g/dL (ref 6.0–8.3)

## 2017-08-17 ENCOUNTER — Emergency Department (HOSPITAL_COMMUNITY): Payer: BLUE CROSS/BLUE SHIELD

## 2017-08-17 ENCOUNTER — Observation Stay (HOSPITAL_COMMUNITY)
Admission: EM | Admit: 2017-08-17 | Discharge: 2017-08-18 | Disposition: A | Discharge status: Home / Self Care | Payer: BLUE CROSS/BLUE SHIELD | Attending: Internal Medicine | Admitting: Internal Medicine

## 2017-08-17 ENCOUNTER — Encounter (HOSPITAL_COMMUNITY): Payer: Self-pay | Admitting: Emergency Medicine

## 2017-08-17 DIAGNOSIS — I10 Essential (primary) hypertension: Secondary | ICD-10-CM | POA: Diagnosis present

## 2017-08-17 DIAGNOSIS — E669 Obesity, unspecified: Secondary | ICD-10-CM | POA: Diagnosis not present

## 2017-08-17 DIAGNOSIS — D72829 Elevated white blood cell count, unspecified: Secondary | ICD-10-CM | POA: Insufficient documentation

## 2017-08-17 DIAGNOSIS — Z79899 Other long term (current) drug therapy: Secondary | ICD-10-CM | POA: Diagnosis not present

## 2017-08-17 DIAGNOSIS — I119 Hypertensive heart disease without heart failure: Secondary | ICD-10-CM | POA: Insufficient documentation

## 2017-08-17 DIAGNOSIS — Z7982 Long term (current) use of aspirin: Secondary | ICD-10-CM | POA: Diagnosis not present

## 2017-08-17 DIAGNOSIS — F17210 Nicotine dependence, cigarettes, uncomplicated: Secondary | ICD-10-CM | POA: Diagnosis not present

## 2017-08-17 DIAGNOSIS — F17200 Nicotine dependence, unspecified, uncomplicated: Secondary | ICD-10-CM | POA: Diagnosis present

## 2017-08-17 DIAGNOSIS — R0789 Other chest pain: Principal | ICD-10-CM | POA: Insufficient documentation

## 2017-08-17 DIAGNOSIS — Z6835 Body mass index (BMI) 35.0-35.9, adult: Secondary | ICD-10-CM | POA: Diagnosis not present

## 2017-08-17 DIAGNOSIS — R079 Chest pain, unspecified: Secondary | ICD-10-CM | POA: Diagnosis not present

## 2017-08-17 DIAGNOSIS — D3500 Benign neoplasm of unspecified adrenal gland: Secondary | ICD-10-CM | POA: Diagnosis present

## 2017-08-17 LAB — BASIC METABOLIC PANEL
Anion gap: 10 (ref 5–15)
BUN: 11 mg/dL (ref 6–20)
CO2: 26 mmol/L (ref 22–32)
CREATININE: 0.99 mg/dL (ref 0.61–1.24)
Calcium: 9.2 mg/dL (ref 8.9–10.3)
Chloride: 99 mmol/L — ABNORMAL LOW (ref 101–111)
GFR calc Af Amer: >60 mL/min (ref >60)
GFR calc non Af Amer: >60 mL/min (ref >60)
Glucose, Bld: 121 mg/dL — ABNORMAL HIGH (ref 65–99)
Potassium: 3.9 mmol/L (ref 3.5–5.1)
Sodium: 135 mmol/L (ref 135–145)

## 2017-08-17 LAB — CBC
HCT: 43.1 % (ref 39.0–52.0)
Hemoglobin: 14.7 g/dL (ref 13.0–17.0)
MCH: 28.7 pg (ref 26.0–34.0)
MCHC: 34.1 g/dL (ref 30.0–36.0)
MCV: 84 fL (ref 78.0–100.0)
Platelets: 202 10*3/uL (ref 150–400)
RBC: 5.13 MIL/uL (ref 4.22–5.81)
RDW: 14.5 % (ref 11.5–15.5)
WBC: 12 10*3/uL — ABNORMAL HIGH (ref 4.0–10.5)

## 2017-08-17 LAB — LIPID PANEL
Cholesterol: 223 mg/dL — ABNORMAL HIGH (ref 0–200)
HDL: 59 mg/dL (ref >40)
LDL CALC: 145 mg/dL — AB (ref 0–99)
Total CHOL/HDL Ratio: 3.8 RATIO
Triglycerides: 96 mg/dL (ref <150)
VLDL: 19 mg/dL (ref 0–40)

## 2017-08-17 LAB — I-STAT TROPONIN, ED: Troponin i, poc: 0.01 ng/mL (ref 0.00–0.08)

## 2017-08-17 LAB — TROPONIN I: Troponin I: <0.03 ng/mL (ref <0.03)

## 2017-08-17 LAB — HEMOGLOBIN A1C
Hgb A1c MFr Bld: 5.5 % (ref 4.8–5.6)
MEAN PLASMA GLUCOSE: 111.15 mg/dL

## 2017-08-17 MED ORDER — ENOXAPARIN SODIUM 60 MG/0.6ML ~~LOC~~ SOLN
60.0000 mg | SUBCUTANEOUS | Status: DC
  Administered 2017-08-17 – 2017-08-18 (×2): 60 mg via SUBCUTANEOUS
  Filled 2017-08-17 (×2): qty 0.6

## 2017-08-17 MED ORDER — LISINOPRIL 20 MG PO TABS
40.0000 mg | ORAL_TABLET | Freq: Every day | ORAL | Status: DC
  Administered 2017-08-17 – 2017-08-18 (×2): 40 mg via ORAL
  Filled 2017-08-17 (×2): qty 2

## 2017-08-17 MED ORDER — ASPIRIN 81 MG PO CHEW
324.0000 mg | CHEWABLE_TABLET | Freq: Once | ORAL | Status: AC
  Administered 2017-08-17: 324 mg via ORAL
  Filled 2017-08-17: qty 4

## 2017-08-17 MED ORDER — NITROGLYCERIN 0.4 MG SL SUBL
0.4000 mg | SUBLINGUAL_TABLET | SUBLINGUAL | Status: DC | PRN
  Administered 2017-08-17: 0.4 mg via SUBLINGUAL
  Filled 2017-08-17: qty 1

## 2017-08-17 MED ORDER — METOPROLOL TARTRATE 50 MG PO TABS
100.0000 mg | ORAL_TABLET | Freq: Two times a day (BID) | ORAL | Status: DC
  Administered 2017-08-17 – 2017-08-18 (×3): 100 mg via ORAL
  Filled 2017-08-17 (×3): qty 2

## 2017-08-17 MED ORDER — NITROGLYCERIN 0.4 MG SL SUBL
0.4000 mg | SUBLINGUAL_TABLET | SUBLINGUAL | Status: DC | PRN
Start: 2017-08-17 — End: 2017-08-18

## 2017-08-17 MED ORDER — ONDANSETRON HCL 4 MG/2ML IJ SOLN: 4.0000 mg | Freq: Four times a day (QID) | INTRAMUSCULAR | Status: DC | PRN

## 2017-08-17 MED ORDER — AMLODIPINE BESYLATE 10 MG PO TABS
10.0000 mg | ORAL_TABLET | Freq: Two times a day (BID) | ORAL | Status: DC
Start: 2017-08-17 — End: 2017-08-18
  Administered 2017-08-17 – 2017-08-18 (×3): 10 mg via ORAL
  Filled 2017-08-17 (×3): qty 1

## 2017-08-17 MED ORDER — ACETAMINOPHEN 325 MG PO TABS
650.0000 mg | ORAL_TABLET | ORAL | Status: DC | PRN
  Administered 2017-08-17 – 2017-08-18 (×2): 650 mg via ORAL
  Filled 2017-08-17 (×2): qty 2

## 2017-08-17 NOTE — ED Provider Notes (Signed)
TIME SEEN: 5:59 AM  CHIEF COMPLAINT: chest pain  HPI: Pt is a 56 year old male with history of hypertension, tobacco use, obesity who presents to the emergency department with complaints of chest pain.  Pain started yesterday and has been intermittent. Describes it as in the center of his chest like a pressure and someone sitting on him. Has had associated diaphoresis. No shortness of breath, nausea or vomiting, dizziness. No lower extremity swelling or pain. No fevers or cough. No family history of CAD. Denies diabetes, hyperlipidemia. Denies history of stress test or cardiac catheterization. Pain is worse with exertion and better with rest. Having "slight" pain now.  No history of MI. No history of PE or DVT.  ROS: See HPI Constitutional: no fever  Eyes: no drainage  ENT: no runny nose   Cardiovascular:   chest pain  Resp: no SOB  GI: no vomiting GU: no dysuria Integumentary: no rash  Allergy: no hives  Musculoskeletal: no leg swelling  Neurological: no slurred speech ROS otherwise negative  PAST MEDICAL HISTORY/PAST SURGICAL HISTORY:  Past Medical History:  Diagnosis Date  . Hypertension     MEDICATIONS:  Prior to Admission medications   Medication Sig Start Date End Date Taking? Authorizing Provider  amLODipine (NORVASC) 10 MG tablet Take 10 mg by mouth 2 (two) times daily.      [provider]  gemfibrozil (LOPID) 600 MG tablet Take 1 tablet (600 mg total) by mouth 2 (two) times daily before a meal. 08/01/14   Jegede, Olugbemiga E, MD  metoprolol tartrate (LOPRESSOR) 25 MG tablet Take 25 mg by mouth 2 (two) times daily.    [provider]  Multiple Vitamin (MULTIVITAMIN WITH MINERALS) TABS tablet Take 1 tablet by mouth daily.    [provider]  sulfamethoxazole-trimethoprim (SEPTRA DS) 800-160 MG per tablet Take 1 tablet by mouth every 12 (twelve) hours. 07/18/14   Harris, Vernie Shanks, PA-C  terbinafine (LAMISIL) 250 MG tablet Take 1 tablet (250 mg total)  by mouth daily. 03/12/15   Wallene Huh, DPM    ALLERGIES:  No Known Allergies  SOCIAL HISTORY:  Social History  Substance Use Topics  . Smoking status: Current Some Day Smoker  . Smokeless tobacco: Not on file  . Alcohol use Yes     Comment: occcasional    FAMILY HISTORY: Family History  Problem Relation Age of Onset  . Diabetes Mother     EXAM: BP (!) 156/99   Pulse 77   Temp 99.3 F (37.4 C) (Oral)   Resp (!) 23   SpO2 97%  CONSTITUTIONAL: Alert and oriented and responds appropriately to questions. Well-appearing; well-nourished, tearful, appears anxious HEAD: Normocephalic EYES: Conjunctivae clear, pupils appear equal, EOMI ENT: normal nose; moist mucous membranes NECK: Supple, no meningismus, no nuchal rigidity, no LAD  CARD: RRR; S1 and S2 appreciated; no murmurs, no clicks, no rubs, no gallops RESP: Normal chest excursion without splinting or tachypnea; breath sounds clear and equal bilaterally; no wheezes, no rhonchi, no rales, no hypoxia or respiratory distress, speaking full sentences ABD/GI: Normal bowel sounds; non-distended; soft, non-tender, no rebound, no guarding, no peritoneal signs, no hepatosplenomegaly BACK:  The back appears normal and is non-tender to palpation, there is no CVA tenderness EXT: Normal ROM in all joints; non-tender to palpation; no edema; normal capillary refill; no cyanosis, no calf tenderness or swelling    SKIN: Normal color for age and race; warm; no rash NEURO: Moves all extremities equally PSYCH: The patient's mood  and manner are appropriate. Grooming and personal hygiene are appropriate.  MEDICAL DECISION MAKING: Pt here with chest pain. Has a concerning story. EKG shows no new ischemic abnormality compared to his previous.  Will obtain labs, chest x-ray. Doubt PE or dissection. I feel he will need admission. We'll give aspirin, nitroglycerin. Patient comfortable with this plan. He does not currently have a primary care  provider.  ED PROGRESS: Pain improving with nitroglycerin. Troponin negative.  7:00 AM  Pt now chest pain-free. Labs unremarkable other than mild leukocytosis. Chest x-ray clear. We'll discuss with medicine for admission. Heart score is 4.  7:19 AM Discussed patient's case with hospitalist, Dr. Florene Glen.  I have recommended admission and patient (and family if present) agree with this plan. Admitting physician will place admission orders.   I reviewed all nursing notes, vitals, pertinent previous records, EKGs, lab and urine results, imaging (as available).      EKG Interpretation  Date/Time:  Tuesday August 17 2017 05:56:10 EDT Ventricular Rate:  75 PR Interval:    QRS Duration: 97 QT Interval:  385 QTC Calculation: 430 R Axis:   -18 Text Interpretation:  Sinus rhythm Probable left atrial enlargement Abnormal R-wave progression, early transition Left ventricular hypertrophy Minimal ST elevation, lateral leads No significant change since last tracing Confirmed by Makailah Slavick, Cyril Mourning [phone]) on 08/17/2017 5:59:35 AM         Ranferi Clingan, Delice Bison, DO 08/17/17 0630

## 2017-08-17 NOTE — ED Triage Notes (Signed)
Pt comes in this morning c/o chest pressure in the center of his chest  Pt states it is not pain but feels like he has a knot in his chest  Pt states he has not felt good for the past few days and has had some nausea and has had decreased appetite  Pt states he has just felt tired and run down

## 2017-08-17 NOTE — H&P (Addendum)
History and Physical    [name] ZOX:[id number] DOB: 12-13-1960 DOA: 08/17/2017  PCP: Patient, No Pcp Per Patient coming from: home  I have personally briefly reviewed patient's old medical records in [place]  Chief Complaint: chest pain  HPI: [name] is [name] 56 y.o. male with medical history significant of HTN and L adrenalectomy presenting with chest pain.   [name] states that his pain started yesterday morning. He's never had chest pain before. He describes the chest pain as substernal, tightness, lasting upwards of 3 minutes. He describes it is relatively constant, but worse with exertion and laying down made it better. He had associated diaphoresis. He denies any numbness or tingling. He notes chest pain improved after Yandiel Bergum glycerin. He denies any family history of heart attacks or strokes. He does smoke 2-3 cigarettes Varina Hulon day. He drinks, but not daily. No other drug use.  He denies fevers, chills, nausea, vomiting, lower extremity edema.   ED Course: CXR, EKG, ASA, nitro.  Admit for CP rule out.   Review of Systems: As per HPI otherwise 10 point review of systems negative.   Past Medical History:  Diagnosis Date  . Hypertension     Past Surgical History:  Procedure Laterality Date  . ADRENAL GLAND SURGERY Left 2012   Removed  . HERNIA REPAIR     umbilical     reports that he has been smoking.  He has never used smokeless tobacco. He reports that he drinks alcohol. He reports that he does not use drugs.  No Known Allergies  Family History  Problem Relation Age of Onset  . Diabetes Mother    Prior to Admission medications   Medication Sig Start Date End Date Taking? Authorizing Provider  amLODipine (NORVASC) 10 MG tablet Take 10 mg by mouth 2 (two) times daily.     Yes [provider]  ibuprofen (ADVIL,MOTRIN) 200 MG tablet Take 400 mg by mouth every 6 (six) hours as needed for moderate pain.   Yes [provider]  lisinopril  (PRINIVIL,ZESTRIL) 40 MG tablet Take 40 mg by mouth daily. 08/16/17  Yes [provider]  metoprolol tartrate (LOPRESSOR) 100 MG tablet Take 100 mg by mouth 2 (two) times daily. 08/05/17  Yes [provider]  Multiple Vitamin (MULTIVITAMIN WITH MINERALS) TABS tablet Take 1 tablet by mouth daily.   Yes [provider]  naproxen sodium (ANAPROX) 220 MG tablet Take 220 mg by mouth 2 (two) times daily with Akhila Mahnken meal.   Yes [provider]  gemfibrozil (LOPID) 600 MG tablet Take 1 tablet (600 mg total) by mouth 2 (two) times daily before Chauntel Windsor meal. Patient not taking: Reported on 08/17/2017 08/01/14   Tresa Garter, MD    Physical Exam: Vitals:   08/17/17 0745 08/17/17 0800 08/17/17 0815 08/17/17 0830  BP: 138/85 (!) 144/80 (!) 150/84 132/73  Pulse: 77 77 79 76  Resp: (!) 22 (!) 21 20 (!) 21  Temp:      TempSrc:      SpO2: 94% 90% 92% 94%    Constitutional: NAD, calm, comfortable Vitals:   08/17/17 0745 08/17/17 0800 08/17/17 0815 08/17/17 0830  BP: 138/85 (!) 144/80 (!) 150/84 132/73  Pulse: 77 77 79 76  Resp: (!) 22 (!) 21 20 (!) 21  Temp:      TempSrc:      SpO2: 94% 90% 92% 94%   Eyes: PERRL, lids and conjunctivae normal ENMT: Mucous membranes are moist.  Neck: normal, supple, no masses Respiratory: clear to auscultation bilaterally, no wheezing, no crackles. Normal respiratory effort. No accessory muscle use.  Cardiovascular: Regular rate and rhythm, no murmurs / rubs / gallops. No extremity edema. 2+ pedal pulses. No carotid bruits.  Abdomen: no tenderness, no masses palpated. No hepatosplenomegaly. Bowel sounds positive.  Musculoskeletal: no clubbing / cyanosis. No joint deformity upper and lower extremities. Good ROM, no contractures. Normal muscle tone.  Skin: no rashes, lesions, ulcers. No induration Neurologic: CN 2-12 grossly intact. Sensation intact Strength 5/5 in all 4.  Psychiatric: Normal judgment and insight. Alert and oriented x  3. Normal mood.   Labs on Admission: I have personally reviewed following labs and imaging studies  CBC:  Recent Labs Lab 08/17/17 0610  WBC 12.0*  HGB 14.7  HCT 43.1  MCV 84.0  PLT 485   Basic Metabolic Panel:  Recent Labs Lab 08/17/17 0610  NA 135  K 3.9  CL 99*  CO2 26  GLUCOSE 121*  BUN 11  CREATININE 0.99  CALCIUM 9.2   GFR: CrCl cannot be calculated (Unknown ideal weight.). Liver Function Tests: No results for input(s): AST, ALT, ALKPHOS, BILITOT, PROT, ALBUMIN in the last 168 hours. No results for input(s): LIPASE, AMYLASE in the last 168 hours. No results for input(s): AMMONIA in the last 168 hours. Coagulation Profile: No results for input(s): INR, PROTIME in the last 168 hours. Cardiac Enzymes:  Recent Labs Lab 08/17/17 0610  TROPONINI <0.03   BNP (last 3 results) No results for input(s): PROBNP in the last 8760 hours. HbA1C: No results for input(s): HGBA1C in the last 72 hours. CBG: No results for input(s): GLUCAP in the last 168 hours. Lipid Profile: No results for input(s): CHOL, HDL, LDLCALC, TRIG, CHOLHDL, LDLDIRECT in the last 72 hours. Thyroid Function Tests: No results for input(s): TSH, T4TOTAL, FREET4, T3FREE, THYROIDAB in the last 72 hours. Anemia Panel: No results for input(s): VITAMINB12, FOLATE, FERRITIN, TIBC, IRON, RETICCTPCT in the last 72 hours. Urine analysis:    Component Value Date/Time   COLORURINE YELLOW 05/12/2011 [place] 05/12/2011 1045   LABSPEC 1.019 05/12/2011 1045   PHURINE 7.0 05/12/2011 1045   GLUCOSEU NEGATIVE 05/12/2011 1045   HGBUR NEGATIVE 05/12/2011 [place] 05/12/2011 [place] 05/12/2011 1045   PROTEINUR NEGATIVE 05/12/2011 1045   UROBILINOGEN 0.2 05/12/2011 1045   NITRITE NEGATIVE 05/12/2011 [place] 05/12/2011 1045    Radiological Exams on Admission: Dg Chest 2 View  Result Date: 08/17/2017 CLINICAL DATA:  Mid chest  pain since yesterday afternoon. EXAM: CHEST  2 VIEW COMPARISON:  05/12/2011 FINDINGS: Normal heart size and pulmonary vascularity. No focal airspace disease or consolidation in the lungs. No blunting of costophrenic angles. No pneumothorax. Mediastinal contours appear intact. Tortuous aorta. IMPRESSION: No active cardiopulmonary disease. Electronically Signed   By: Lucienne Capers M.D.   On: 08/17/2017 06:54    EKG: Independently reviewed. Appears similar to priors.  NSR, LVH.  Assessment/Plan Active Problems:   Chest pain   Chest Pain:  Typical in description.  Improved with nitro.  Worse with activity, better with rest.  Risk factors with HTN and smoking.  No known FH.  Negative CXR.  EKG similar to priors.  Currently CP free.  Heart score 4-5 S/p ASA in ED Initial troponin negative, trend troponins Lipids and hemoglobin A1c Will consult cards for possible stress test if rules out for ACS with negative troponins given his  typical symptoms  Leukocytosis: mild, no infectious sx.  CTM.   Hypertension:  Continue metop, lisinopril, amlodipine  Tobacco Abuse: 2-3 cigarettes daily.  Encouraged cessation.    HLD? - gemfibrozil on chart, but he's not taking this Lipid panel as above  History of L adrenalectomy  Patient does not have Briony Parveen primary care physician. We'll consult care management for PCP.  DVT prophylaxis: lovenox  Code Status: full  Family Communication: none in room  Disposition Plan: home Consults called: Cardiology, [place]   Admission status: obs    Fayrene Helper MD [facility] [phone]  If 7PM-7AM, please contact night-coverage [email or web address] Password [email or web address]  08/17/2017, 8:53 AM

## 2017-08-17 NOTE — ED Notes (Signed)
Please call report to [facility] at [phone] at 1230.

## 2017-08-17 NOTE — ED Notes (Addendum)
Attempted to give report to [place]. Nurse not available at this time. Will call back at 1310.

## 2017-08-17 NOTE — ED Notes (Signed)
Report given to [place]. KV2O RM#[id number].

## 2017-08-17 NOTE — ED Notes (Signed)
Patient denies pain and is resting comfortably.  

## 2017-08-17 NOTE — Consult Note (Signed)
Referring Physician:  [name] is an 56 y.o. male.                       Chief Complaint: Chest pain  HPI: 56 year old male with PMH of HTN, tobacco use disorder, Obesity and Left adrenalectomy has substernal chest tightness increasing with activity. No fever, positive chills from feeling cold in ED, no nausea, vomiting cough or leg edema. EKG shows LVH with repolarization changes as before. Chest x-ray is unremarkable.  Past Medical History:  Diagnosis Date  . Hypertension       Past Surgical History:  Procedure Laterality Date  . ADRENAL GLAND SURGERY Left 2012   Removed  . HERNIA REPAIR     umbilical    Family History  Problem Relation Age of Onset  . Diabetes Mother    Social History:  reports that he has been smoking.  He has never used smokeless tobacco. He reports that he drinks alcohol. He reports that he does not use drugs.  Allergies: No Known Allergies   (Not in a hospital admission)  Results for orders placed or performed during the hospital encounter of 08/17/17 (from the past 48 hour(s))  Basic metabolic panel     Status: Abnormal   Collection Time: 08/17/17  6:10 AM  Result Value Ref Range   Sodium 135 135 - 145 mmol/L   Potassium 3.9 3.5 - 5.1 mmol/L   Chloride 99 (L) 101 - 111 mmol/L   CO2 26 22 - 32 mmol/L   Glucose, Bld 121 (H) 65 - 99 mg/dL   BUN 11 6 - 20 mg/dL   Creatinine, Ser 0.99 0.61 - 1.24 mg/dL   Calcium 9.2 8.9 - 10.3 mg/dL   GFR calc non Af Amer >60 >60 mL/min   GFR calc Af Amer >60 >60 mL/min    Comment: (NOTE) The eGFR has been calculated using the CKD EPI equation. This calculation has not been validated in all clinical situations. eGFR's persistently <60 mL/min signify possible Chronic Kidney Disease.    Anion gap 10 5 - 15  CBC     Status: Abnormal   Collection Time: 08/17/17  6:10 AM  Result Value Ref Range   WBC 12.0 (H) 4.0 - 10.5 K/uL   RBC 5.13 4.22 - 5.81 MIL/uL   Hemoglobin 14.7 13.0 - 17.0 g/dL   HCT 43.1  39.0 - 52.0 %   MCV 84.0 78.0 - 100.0 fL   MCH 28.7 26.0 - 34.0 pg   MCHC 34.1 30.0 - 36.0 g/dL   RDW 14.5 11.5 - 15.5 %   Platelets 202 150 - 400 K/uL  Troponin I     Status: None   Collection Time: 08/17/17  6:10 AM  Result Value Ref Range   Troponin I <0.03 <0.03 ng/mL  I-stat troponin, ED     Status: None   Collection Time: 08/17/17  6:25 AM  Result Value Ref Range   Troponin i, poc 0.01 0.00 - 0.08 ng/mL   Comment 3            Comment: Due to the release kinetics of cTnI, a negative result within the first hours of the onset of symptoms does not rule out myocardial infarction with certainty. If myocardial infarction is still suspected, repeat the test at appropriate intervals.    Dg Chest 2 View  Result Date: 08/17/2017 CLINICAL DATA:  Mid chest pain since yesterday afternoon. EXAM: CHEST  2 VIEW COMPARISON:  05/12/2011 FINDINGS: Normal heart size and pulmonary vascularity. No focal airspace disease or consolidation in the lungs. No blunting of costophrenic angles. No pneumothorax. Mediastinal contours appear intact. Tortuous aorta. IMPRESSION: No active cardiopulmonary disease. Electronically Signed   By: Lucienne Capers M.D.   On: 08/17/2017 06:54    Review Of Systems Constitutional: No fever, chills, weight loss or gain. Eyes: No vision change, wears glasses. No discharge or pain. Ears: No hearing loss, No tinnitus. Respiratory: No asthma, COPD, pneumonias. No shortness of breath. No hemoptysis. Cardiovascular: Positive chest pain, palpitation, leg edema. Gastrointestinal: No nausea, vomiting, diarrhea, constipation. No GI bleed. No hepatitis. Genitourinary: No dysuria, hematuria, kidney stone. No incontinance. Neurological: No headache, stroke, seizures.  Psychiatry: No psych facility admission for anxiety, depression, suicide. No detox. Skin: No rash. Musculoskeletal: Positive joint pain, no fibromyalgia. No neck pain, back pain. Lymphadenopathy: No  lymphadenopathy. Hematology: No anemia or easy bruising.   Blood pressure 132/73, pulse 76, temperature 99.3 F (37.4 C), temperature source Oral, resp. rate (!) 21, SpO2 94 %. There is no height or weight on file to calculate BMI. General appearance: alert, cooperative, appears stated age and no distress Head: Normocephalic, atraumatic. Eyes: Brown eyes, pink conjunctiva, corneas clear. PERRL, EOM's intact. Neck: No adenopathy, no carotid bruit, no JVD, supple, symmetrical, trachea midline and thyroid not enlarged. Resp: Clear to auscultation bilaterally. Cardio: Regular rate and rhythm, S1, S2 normal, II/VI systolic murmur, no click, rub or gallop GI: Soft, non-tender; bowel sounds normal; no organomegaly. Extremities: No edema, cyanosis or clubbing. Skin: Warm and dry.  Neurologic: Alert and oriented X 3, normal strength. Normal coordination and gait.  Assessment/Plan Chest pain r/o MI Hypertension Obesity  Nuclear stress test in AM post NPO (as he ate today) Add Hydralazine for blood pressure control along with amlodipine, ACE inhibitor and B-blocker.  Birdie Riddle, MD  08/17/2017, 10:08 AM

## 2017-08-18 ENCOUNTER — Ambulatory Visit (HOSPITAL_COMMUNITY)
Admit: 2017-08-18 | Discharge: 2017-08-18 | Disposition: A | Discharge status: Home / Self Care | Payer: BLUE CROSS/BLUE SHIELD | Source: Ambulatory Visit | Attending: Cardiovascular Disease | Admitting: Cardiovascular Disease

## 2017-08-18 DIAGNOSIS — R0789 Other chest pain: Secondary | ICD-10-CM | POA: Diagnosis not present

## 2017-08-18 DIAGNOSIS — R079 Chest pain, unspecified: Secondary | ICD-10-CM | POA: Insufficient documentation

## 2017-08-18 LAB — CBC
HEMATOCRIT: 40.9 % (ref 39.0–52.0)
Hemoglobin: 14.3 g/dL (ref 13.0–17.0)
MCH: 29.2 pg (ref 26.0–34.0)
MCHC: 35 g/dL (ref 30.0–36.0)
MCV: 83.5 fL (ref 78.0–100.0)
Platelets: 166 10*3/uL (ref 150–400)
RBC: 4.9 MIL/uL (ref 4.22–5.81)
RDW: 14.9 % (ref 11.5–15.5)
WBC: 14.7 10*3/uL — ABNORMAL HIGH (ref 4.0–10.5)

## 2017-08-18 LAB — HIV ANTIBODY (ROUTINE TESTING W REFLEX): HIV SCREEN 4TH GENERATION: NONREACTIVE

## 2017-08-18 LAB — BASIC METABOLIC PANEL
Anion gap: 10 (ref 5–15)
BUN: 13 mg/dL (ref 6–20)
CALCIUM: 9.2 mg/dL (ref 8.9–10.3)
CO2: 26 mmol/L (ref 22–32)
Chloride: 98 mmol/L — ABNORMAL LOW (ref 101–111)
Creatinine, Ser: 0.85 mg/dL (ref 0.61–1.24)
GFR calc Af Amer: >60 mL/min (ref >60)
GLUCOSE: 118 mg/dL — AB (ref 65–99)
Potassium: 3.8 mmol/L (ref 3.5–5.1)
Sodium: 134 mmol/L — ABNORMAL LOW (ref 135–145)

## 2017-08-18 MED ORDER — TECHNETIUM TC 99M TETROFOSMIN IV KIT
30.0000 | PACK | Freq: Once | INTRAVENOUS | Status: AC | PRN
  Administered 2017-08-18: 30 via INTRAVENOUS

## 2017-08-18 MED ORDER — TECHNETIUM TC 99M TETROFOSMIN IV KIT
10.0000 | PACK | Freq: Once | INTRAVENOUS | Status: AC | PRN
  Administered 2017-08-18: 10 via INTRAVENOUS

## 2017-08-18 MED ORDER — REGADENOSON 0.4 MG/5ML IV SOLN: 0.4000 mg | Freq: Once | INTRAVENOUS | Status: DC

## 2017-08-18 MED ORDER — REGADENOSON 0.4 MG/5ML IV SOLN
INTRAVENOUS | Status: AC
  Filled 2017-08-18: qty 5

## 2017-08-18 MED ORDER — ASPIRIN EC 81 MG PO TBEC: 81.0000 mg | DELAYED_RELEASE_TABLET | Freq: Every day | ORAL | 2 refills | Status: AC

## 2017-08-18 MED ORDER — NITROGLYCERIN 0.4 MG SL SUBL: 0.4000 mg | SUBLINGUAL_TABLET | SUBLINGUAL | 12 refills | Status: AC | PRN

## 2017-08-18 MED ORDER — CLINDAMYCIN HCL 150 MG PO CAPS: 150.0000 mg | ORAL_CAPSULE | Freq: Four times a day (QID) | ORAL | 0 refills | Status: AC

## 2017-08-18 NOTE — Consult Note (Signed)
Ref: Patient, No Pcp Per   Subjective:  Passed nuclear stress test. VS stable.  Objective:  Vital Signs in the last 24 hours: Temp:  [98.2 F (36.8 C)-99.3 F (37.4 C)] 98.2 F (36.8 C) (10/17 1413) Pulse Rate:  [76-106] 92 (10/17 1413) Cardiac Rhythm: Normal sinus rhythm (10/17 0700) Resp:  [16-18] 18 (10/17 1413) BP: (127-162)/(76-89) 127/84 (10/17 1413) SpO2:  [95 %-100 %] 95 % (10/17 1413)  Physical Exam: BP Readings from Last 1 Encounters:  08/18/17 127/84    Wt Readings from Last 1 Encounters:  08/17/17 126.8 kg (279 lb 8 oz)    Weight change:  Body mass index is 35.89 kg/m. HEENT: [place]/AT, Eyes-Brown, PERL, EOMI, Conjunctiva-Pink, Sclera-Non-icteric Neck: No JVD, No bruit, Trachea midline. Lungs:  Clear, Bilateral. Cardiac:  Regular rhythm, normal S1 and S2, no S3. II/VI systolic murmur. Abdomen:  Soft, non-tender. BS present. Extremities:  No edema present. No cyanosis. No clubbing. CNS: AxOx3, Cranial nerves grossly intact, moves all 4 extremities.  Skin: Warm and dry.   Intake/Output from previous day: No intake/output data recorded.    Lab Results: BMET    Component Value Date/Time   NA 134 (L) 08/18/2017 0444   NA 135 08/17/2017 0610   NA 134 (L) 03/12/2014 0330   K 3.8 08/18/2017 0444   K 3.9 08/17/2017 0610   K 3.7 03/12/2014 0330   CL 98 (L) 08/18/2017 0444   CL 99 (L) 08/17/2017 0610   CL 100 03/12/2014 0330   CO2 26 08/18/2017 0444   CO2 26 08/17/2017 0610   CO2 22 03/12/2014 0330   GLUCOSE 118 (H) 08/18/2017 0444   GLUCOSE 121 (H) 08/17/2017 0610   GLUCOSE 126 (H) 03/12/2014 0330   BUN 13 08/18/2017 0444   BUN 11 08/17/2017 0610   BUN 15 03/12/2014 0330   CREATININE 0.85 08/18/2017 0444   CREATININE 0.99 08/17/2017 0610   CREATININE 1.08 03/12/2014 0330   CREATININE 1.11 05/27/2011 1238   CALCIUM 9.2 08/18/2017 0444   CALCIUM 9.2 08/17/2017 0610   CALCIUM 8.9 03/12/2014 0330   GFRNONAA >60 08/18/2017 0444   GFRNONAA >60 08/17/2017  0610   GFRNONAA 77 (L) 03/12/2014 0330   GFRAA >60 08/18/2017 0444   GFRAA >60 08/17/2017 0610   GFRAA 89 (L) 03/12/2014 0330   CBC    Component Value Date/Time   WBC 14.7 (H) 08/18/2017 0444   RBC 4.90 08/18/2017 0444   HGB 14.3 08/18/2017 0444   HCT 40.9 08/18/2017 0444   PLT 166 08/18/2017 0444   MCV 83.5 08/18/2017 0444   MCH 29.2 08/18/2017 0444   MCHC 35.0 08/18/2017 0444   RDW 14.9 08/18/2017 0444   LYMPHSABS 1.2 03/11/2014 2105   MONOABS 1.0 03/11/2014 2105   EOSABS 0.1 03/11/2014 2105   BASOSABS 0.0 03/11/2014 2105   HEPATIC Function Panel No results for input(s): PROT in the last 8760 hours.  Invalid input(s):  ALBUMIN,  AST,  ALT,  ALKPHOS,  BILIDIR,  IBILI HEMOGLOBIN A1C No components found for: HGA1C,  MPG CARDIAC ENZYMES Lab Results  Component Value Date   TROPONINI <0.03 08/17/2017   TROPONINI <0.03 08/17/2017   TROPONINI <0.03 08/17/2017   BNP No results for input(s): PROBNP in the last 8760 hours. TSH No results for input(s): TSH in the last 8760 hours. CHOLESTEROL  Recent Labs  08/17/17 0610  CHOL 223*    Scheduled Meds: . amLODipine  10 mg Oral BID  . enoxaparin (LOVENOX) injection  60 mg Subcutaneous Q24H  .  lisinopril  40 mg Oral Daily  . metoprolol tartrate  100 mg Oral BID   Continuous Infusions: PRN Meds:.acetaminophen, nitroGLYCERIN, ondansetron (ZOFRAN) IV  Assessment/Plan: Chest pain MI ruled out Hypertension Obesity  May discharge from cardiology stand point. F/U 1 week.   LOS: 0 days    Dixie Dials  MD  08/18/2017, 5:29 PM

## 2017-08-18 NOTE — Discharge Summary (Signed)
Physician Discharge Summary  [name] GLO:[id number] DOB: 06-Aug-1961 DOA: 08/17/2017  PCP: Patient, No Pcp Per-Arranged by [facility]  Admit date: 08/17/2017 Discharge date: 08/18/2017  Admitted From: Home Disposition:  Home  Recommendations for Outpatient Follow-up:  1. Follow up with new PCP in 1-2 weeks as arranged by CM  Home Health:No Equipment/Devices:No  Discharge Condition:Stable CODE STATUS:Full Diet recommendation: Heart Healthy  Brief/Interim Summary: [name] is a 56 y.o. male with medical history significant of HTN and L adrenalectomy who presented with typical chest pain. He had an initial EKG which was unrevealing and cardiac enzymes which were trended with no acute findings. His symptoms resolved shortly after admission and cardiology was consult to see the patient. On account of his risk factors, he underwent a stress test which did not demonstrate any signs of ischemia or reversible defect. Please see nuclear medicine report below for full details. The patient does not have any primary care doctors that he currently sees, therefore, this has been arranged by case management. No further chest pain or dyspnea noted during this admission and vital signs are stable. Pt to be discharged on aspirin 81mg  daily along with SL nitroglycerin as needed for any chest discomfort. 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 We will represcribe this for treatment once again.  Discharge Diagnoses:  Principal Problem:   Chest pain Active Problems:   TOBACCO USER   HYPERTENSION, BENIGN   h/o left Adrenal cortical adenoma  Chest Pain-typical; but resolved with no sign of ACS -Continue ASA 81mg  daily along with other BP meds at home -Negative NM stress test -F/U with new PCP as arranged by CM  Hypertension:  Continue metop, lisinopril, amlodipine  Tobacco Abuse: 2-3 cigarettes  daily.  Encouraged cessation.    HLD? - gemfibrozil on chart, but he's not taking this -Refuses other dyslipidemia meds at this time  Oral abscess -Start Oral Clindamycin as prescribed for 10 days  History of L adrenalectomy  Discharge Instructions  Discharge Instructions    Diet - low sodium heart healthy    Complete by:  As directed    Increase activity slowly    Complete by:  As directed      Allergies as of 08/18/2017   No Known Allergies     Medication List    TAKE these medications   amLODipine 10 MG tablet Commonly known as:  NORVASC Take 10 mg by mouth 2 (two) times daily.   aspirin EC 81 MG tablet Take 1 tablet (81 mg total) by mouth daily.   clindamycin 150 MG capsule Commonly known as:  CLEOCIN Take 1 capsule (150 mg total) by mouth 4 (four) times daily.   gemfibrozil 600 MG tablet Commonly known as:  LOPID Take 1 tablet (600 mg total) by mouth 2 (two) times daily before a meal.   ibuprofen 200 MG tablet Commonly known as:  ADVIL,MOTRIN Take 400 mg by mouth every 6 (six) hours as needed for moderate pain.   lisinopril 40 MG tablet Commonly known as:  PRINIVIL,ZESTRIL Take 40 mg by mouth daily.   metoprolol tartrate 100 MG tablet Commonly known as:  LOPRESSOR Take 100 mg by mouth 2 (two) times daily.   multivitamin with minerals Tabs tablet Take 1 tablet by mouth daily.   naproxen sodium 220 MG tablet Commonly known as:  ANAPROX Take 220 mg by mouth 2 (two) times daily with a meal.  nitroGLYCERIN 0.4 MG SL tablet Commonly known as:  NITROSTAT Place 1 tablet (0.4 mg total) under the tongue every 5 (five) minutes as needed for chest pain.      Follow-up Information    [facility] Follow up.   Why:  Call for PCP Appointment [phone]       [facility] Follow up.   Specialty:  Internal Medicine Why:  You can call Monday for PCP appointment if you can not find a PCP.  Contact information: [place] [phone]         No Known Allergies  Consultations:  Cardiology Dr. Dixie Dials   Procedures/Studies: Dg Chest 2 View  Result Date: 08/17/2017 CLINICAL DATA:  Mid chest pain since yesterday afternoon. EXAM: CHEST  2 VIEW COMPARISON:  05/12/2011 FINDINGS: Normal heart size and pulmonary vascularity. No focal airspace disease or consolidation in the lungs. No blunting of costophrenic angles. No pneumothorax. Mediastinal contours appear intact. Tortuous aorta. IMPRESSION: No active cardiopulmonary disease. Electronically Signed   By: Lucienne Capers M.D.   On: 08/17/2017 06:54   Nm Myocar Multi W/spect W/wall Motion / Ef  Result Date: 08/18/2017 CLINICAL DATA:  Chest pain, possible ACS, negative troponin EXAM: MYOCARDIAL IMAGING WITH SPECT (REST AND PHARMACOLOGIC-STRESS) GATED LEFT VENTRICULAR WALL MOTION STUDY LEFT VENTRICULAR EJECTION FRACTION TECHNIQUE: Standard myocardial SPECT imaging was performed after resting intravenous injection of 10 mCi Tc-23m tetrofosmin. Subsequently, intravenous infusion of Lexiscan was performed under the supervision of the Cardiology staff. At peak effect of the drug, 30 mCi Tc-42m tetrofosmin was injected intravenously and standard myocardial SPECT imaging was performed. Quantitative gated imaging was also performed to evaluate left ventricular wall motion, and estimate left ventricular ejection fraction. COMPARISON:  None. FINDINGS: Perfusion: No decreased activity in the left ventricle on stress imaging to suggest reversible ischemia or infarction. Wall Motion: Normal left ventricular wall motion. No left ventricular dilation. Left Ventricular Ejection Fraction: 54 % End diastolic volume 193 ml End systolic volume 65 ml IMPRESSION: 1. No reversible ischemia or infarction. 2. Normal left ventricular wall motion. 3. Left ventricular ejection fraction 54% 4. Non invasive risk stratification*: Low *2012 Appropriate Use  Criteria for Coronary Revascularization Focused Update: J Am Coll Cardiol. 7902;40(9):735-329. [email or web address] Electronically Signed   By: Julian Hy M.D.   On: 08/18/2017 15:08    Discharge Exam: Vitals:   08/18/17 0534 08/18/17 1413  BP: (!) 149/86 127/84  Pulse: 79 92  Resp: 18 18  Temp: 99.3 F (37.4 C) 98.2 F (36.8 C)  SpO2: 100% 95%   Vitals:   08/17/17 1500 08/17/17 2150 08/18/17 0534 08/18/17 1413  BP:  (!) 141/83 (!) 149/86 127/84  Pulse:  78 79 92  Resp:  16 18 18   Temp:  98.9 F (37.2 C) 99.3 F (37.4 C) 98.2 F (36.8 C)  TempSrc:  Oral Oral Oral  SpO2:  97% 100% 95%  Weight: 126.8 kg (279 lb 8 oz)     Height: 6\' 2"  (1.88 m)       General: Pt is alert, awake, not in acute distress Cardiovascular: RRR, S1/S2 +, no rubs, no gallops Respiratory: CTA bilaterally, no wheezing, no rhonchi Abdominal: Soft, NT, ND, bowel sounds + Extremities: no edema, no cyanosis    The results of significant diagnostics from this hospitalization (including imaging, microbiology, ancillary and laboratory) are listed below for reference.     Microbiology: No results found for this or any previous  visit (from the past 240 hour(s)).   Labs: BNP (last 3 results) No results for input(s): BNP in the last 8760 hours. Basic Metabolic Panel:  Recent Labs Lab 08/17/17 0610 08/18/17 0444  NA 135 134*  K 3.9 3.8  CL 99* 98*  CO2 26 26  GLUCOSE 121* 118*  BUN 11 13  CREATININE 0.99 0.85  CALCIUM 9.2 9.2   Liver Function Tests: No results for input(s): AST, ALT, ALKPHOS, BILITOT, PROT, ALBUMIN in the last 168 hours. No results for input(s): LIPASE, AMYLASE in the last 168 hours. No results for input(s): AMMONIA in the last 168 hours. CBC:  Recent Labs Lab 08/17/17 0610 08/18/17 0444  WBC 12.0* 14.7*  HGB 14.7 14.3  HCT 43.1 40.9  MCV 84.0 83.5  PLT 202 166   Cardiac Enzymes:  Recent Labs Lab 08/17/17 0610  08/17/17 1011 08/17/17 1554 08/17/17 2141  TROPONINI <0.03 <0.03 <0.03 <0.03   BNP: Invalid input(s): POCBNP CBG: No results for input(s): GLUCAP in the last 168 hours. D-Dimer No results for input(s): DDIMER in the last 72 hours. Hgb A1c  Recent Labs  08/17/17 0610  HGBA1C 5.5   Lipid Profile  Recent Labs  08/17/17 0610  CHOL 223*  HDL 59  LDLCALC 145*  TRIG 96  CHOLHDL 3.8   Thyroid function studies No results for input(s): TSH, T4TOTAL, T3FREE, THYROIDAB in the last 72 hours.  Invalid input(s): FREET3 Anemia work up No results for input(s): VITAMINB12, FOLATE, FERRITIN, TIBC, IRON, RETICCTPCT in the last 72 hours. Urinalysis    Component Value Date/Time   COLORURINE YELLOW 05/12/2011 1045   APPEARANCEUR CLEAR 05/12/2011 1045   LABSPEC 1.019 05/12/2011 1045   PHURINE 7.0 05/12/2011 1045   GLUCOSEU NEGATIVE 05/12/2011 [place] 05/12/2011 [place] 05/12/2011 [place] 05/12/2011 1045   PROTEINUR NEGATIVE 05/12/2011 1045   UROBILINOGEN 0.2 05/12/2011 1045   NITRITE NEGATIVE 05/12/2011 1045   LEUKOCYTESUR NEGATIVE 05/12/2011 1045   Sepsis Labs Invalid input(s): PROCALCITONIN,  WBC,  LACTICIDVEN Microbiology No results found for this or any previous visit (from the past 240 hour(s)).   Time coordinating discharge: Over 30 minutes  SIGNED:   Rodena Goldmann, DO  [facility] 08/18/2017, 4:37 PM Pager [phone]  If 7PM-7AM, please contact night-coverage [email or web address] Password [email or web address]

## 2018-02-07 IMAGING — CR DG CHEST 2V
2 series · 2 of 2 positions shown · non-contrast
Comparison: 05/12/2011

CLINICAL DATA: Mid chest pain since yesterday afternoon.

EXAM:
CHEST  2 VIEW

[w chest pa]
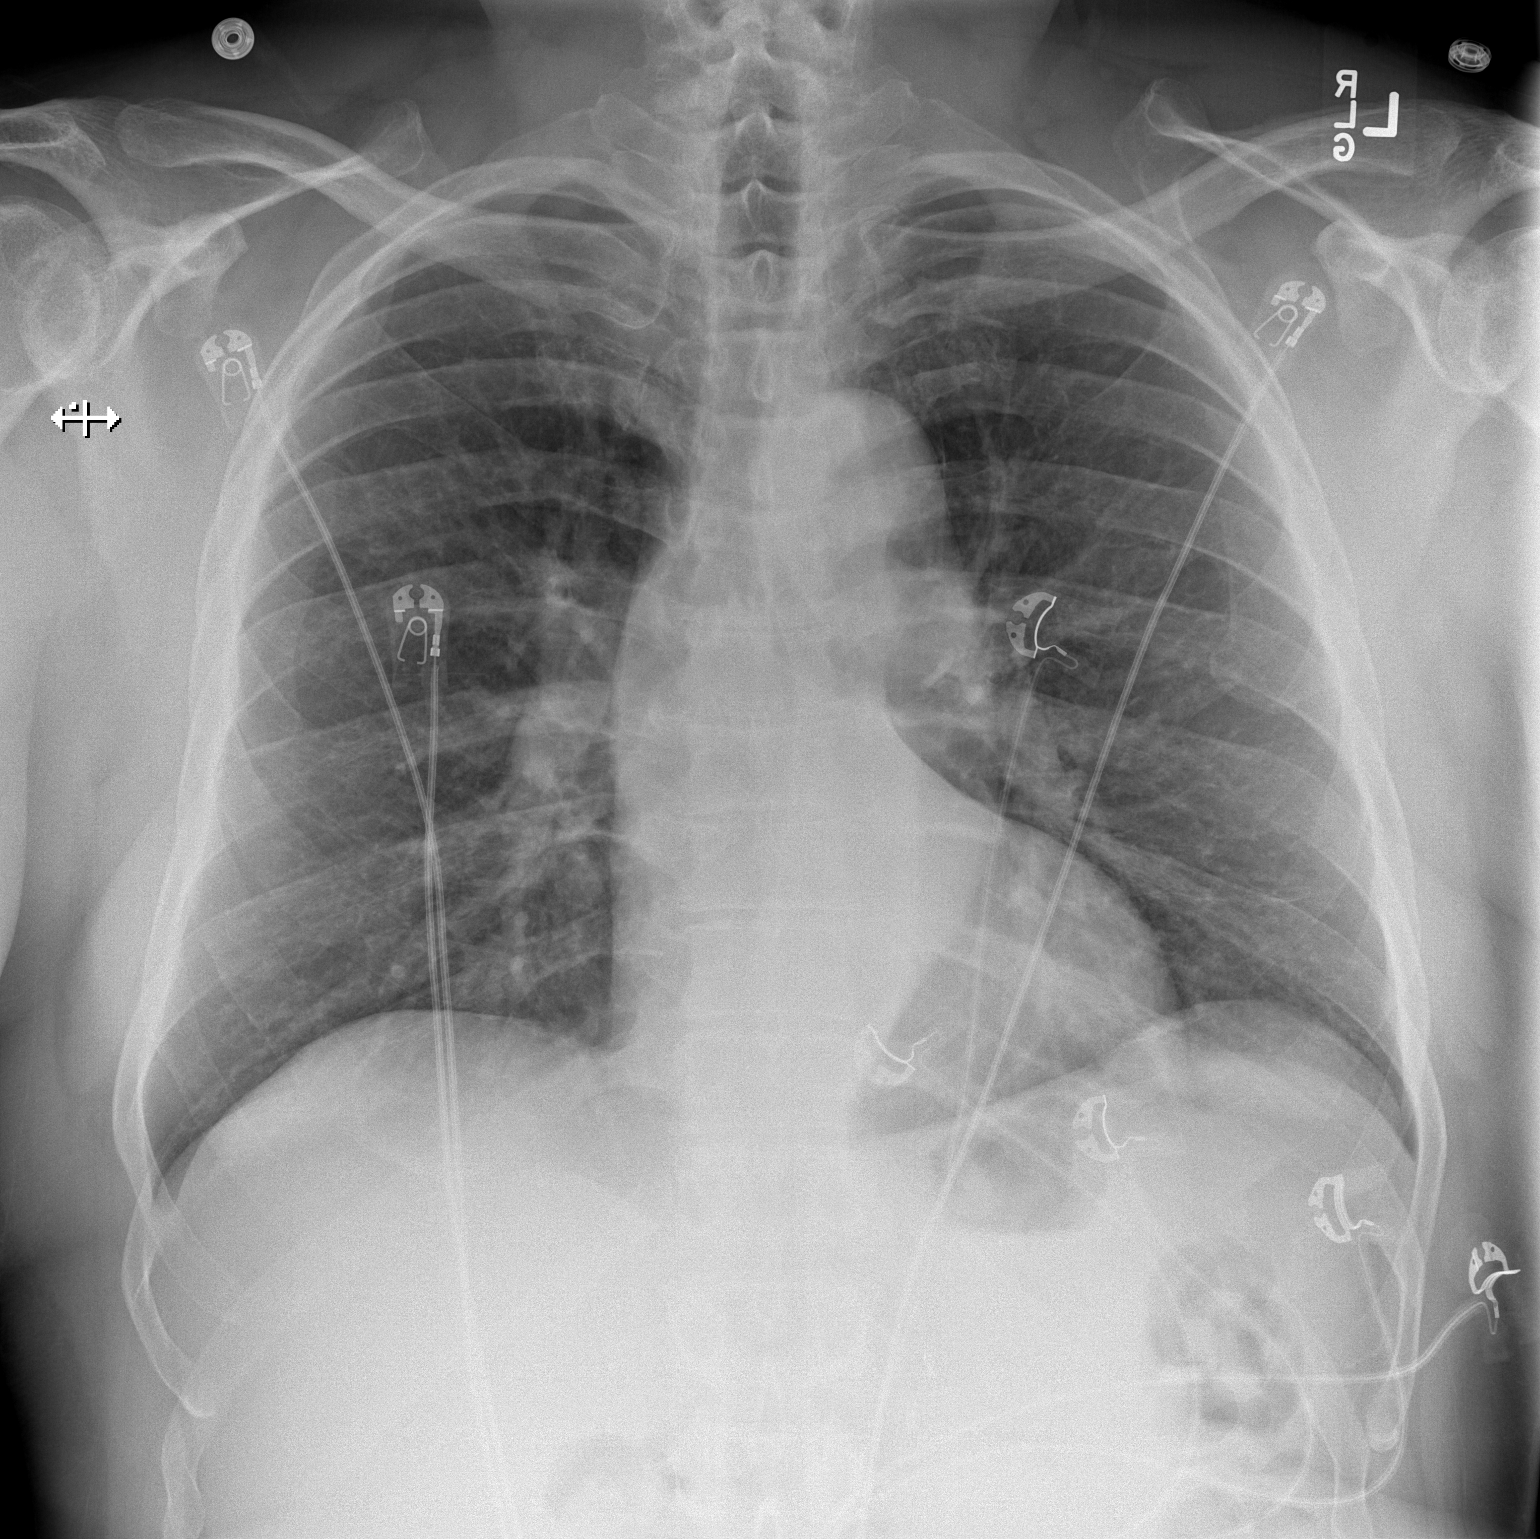

[w chest lat]
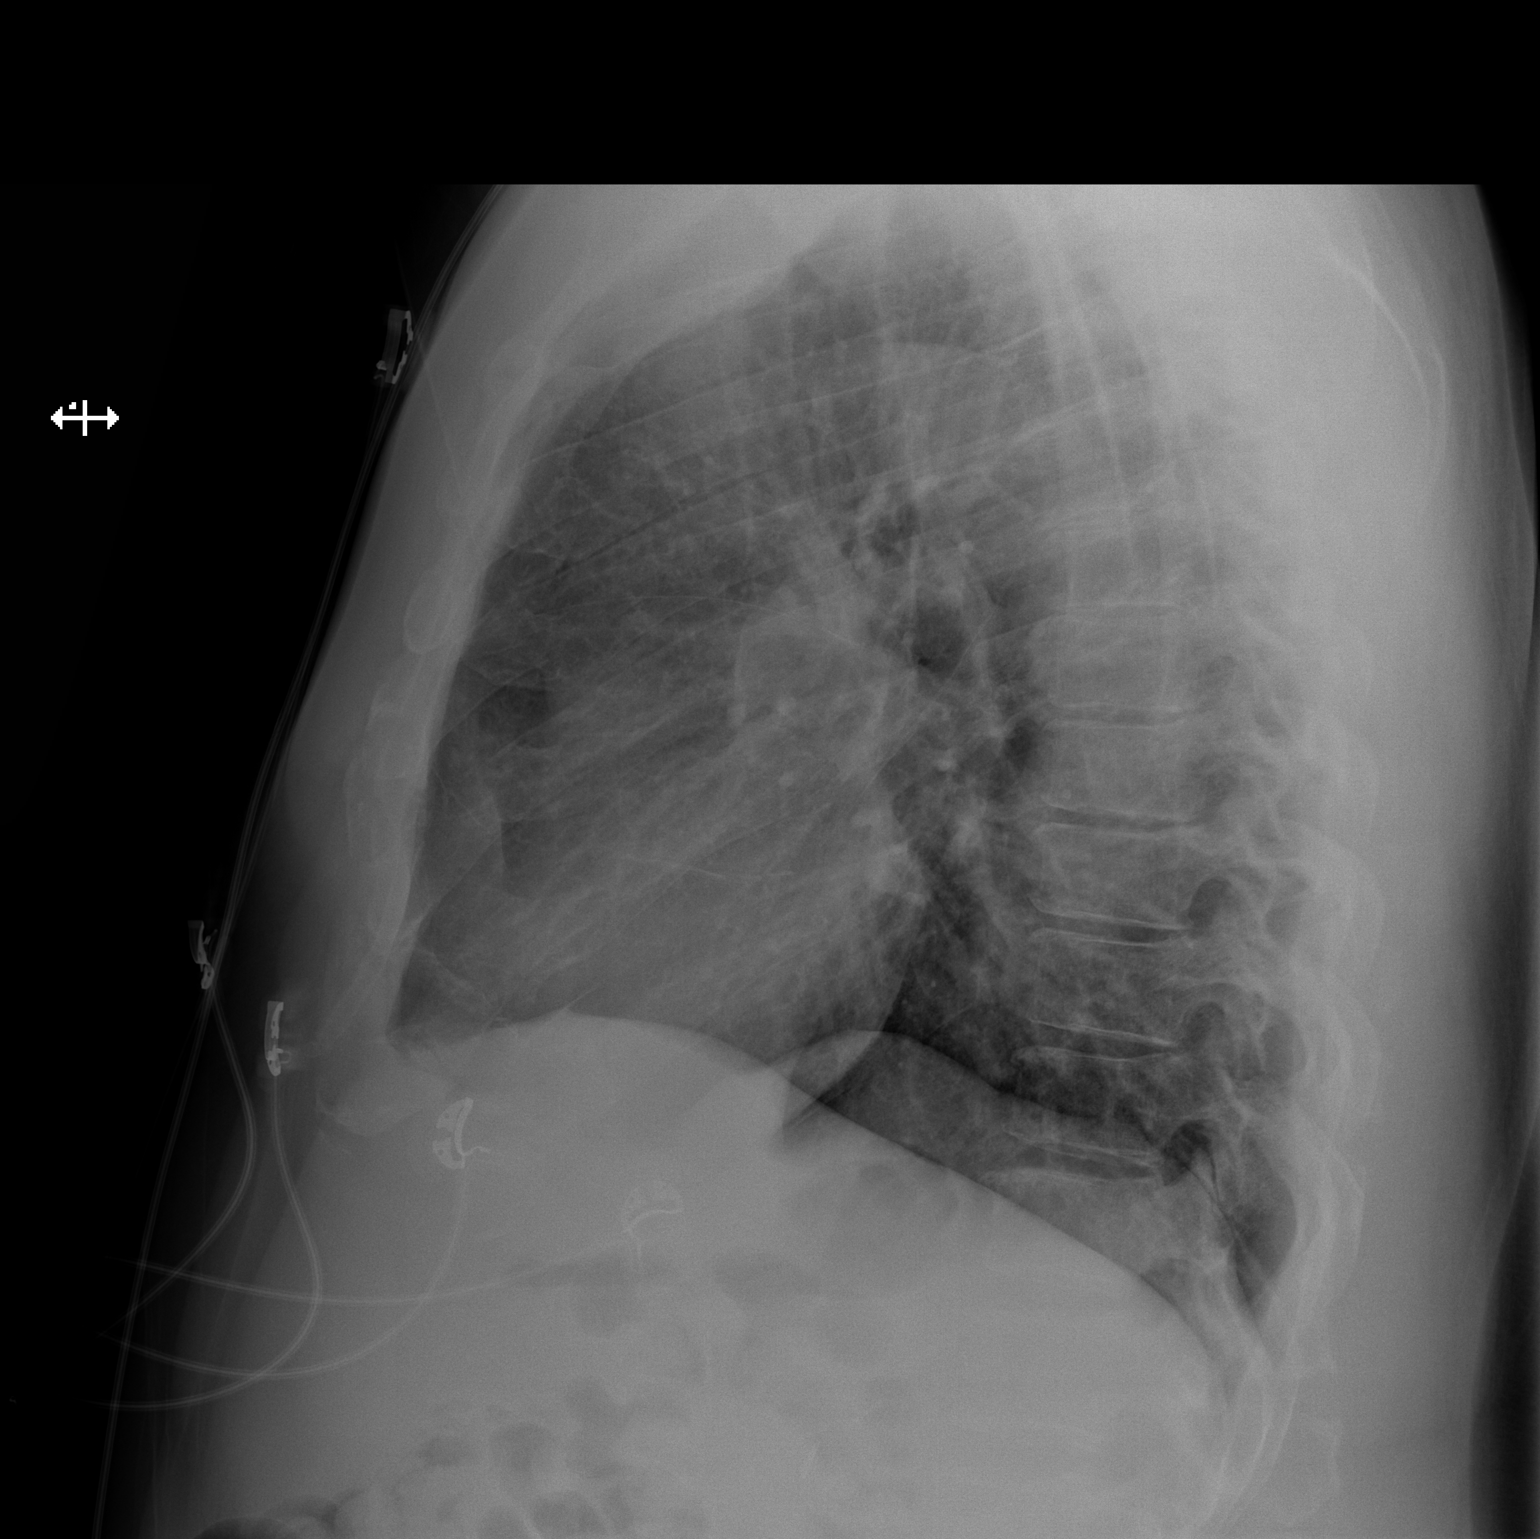

[2 of 2 positions shown; findings below may reference images not displayed]

FINDINGS: Normal heart size and pulmonary vascularity. No focal airspace
disease or consolidation in the lungs. No blunting of costophrenic
angles. No pneumothorax. Mediastinal contours appear intact.
Tortuous aorta.
IMPRESSION: No active cardiopulmonary disease.

## 2021-07-10 ENCOUNTER — Other Ambulatory Visit: Payer: Self-pay

## 2021-07-10 ENCOUNTER — Encounter: Payer: BC Managed Care – PPO | Attending: Physician Assistant | Admitting: Physician Assistant

## 2021-07-10 DIAGNOSIS — I872 Venous insufficiency (chronic) (peripheral): Secondary | ICD-10-CM | POA: Diagnosis not present

## 2021-07-10 DIAGNOSIS — G4730 Sleep apnea, unspecified: Secondary | ICD-10-CM | POA: Insufficient documentation

## 2021-07-10 DIAGNOSIS — L97822 Non-pressure chronic ulcer of other part of left lower leg with fat layer exposed: Secondary | ICD-10-CM | POA: Diagnosis present

## 2021-07-10 DIAGNOSIS — I10 Essential (primary) hypertension: Secondary | ICD-10-CM | POA: Diagnosis not present

## 2021-07-10 NOTE — Progress Notes (Signed)
KRIMSON, [facility] ([id number]) Visit Report for 07/10/2021 Abuse/Suicide Risk Screen Details Patient Name: [name], [name]. Date of Service: 07/10/2021 12:45 PM Medical Record Number: [id number] Patient Account Number: [id number] Date of Birth/Sex: 1961/06/22 (59 y.o. M) Treating RN: Dolan Amen Primary Care Bertie Mcconathy: PATIENT, NO Other Clinician: Referring Gerarda Conklin: Corliss Parish Treating Teonna Coonan/Extender: Skipper Cliche in Treatment: 0 Abuse/Suicide Risk Screen Items Answer ABUSE RISK SCREEN: Has anyone close to you tried to hurt or harm you recentlyo No Do you feel uncomfortable with anyone in your familyo No Has anyone forced you do things that you didnot want to doo No Electronic Signature(s) Signed: 07/10/2021 4:36:05 PM By: Dolan Amen RN Entered By: Dolan Amen on 07/10/2021 13:04:14 [name], [name] ([id number]) -------------------------------------------------------------------------------- Activities of Daily Living Details Patient Name: [name], [name] Date of Service: 07/10/2021 12:45 PM Medical Record Number: [id number] Patient Account Number: [id number] Date of Birth/Sex: 1961-10-16 (60 y.o. M) Treating RN: Dolan Amen Primary Care Torry Istre: PATIENT, NO Other Clinician: Referring Kodey Xue: Corliss Parish Treating Marielouise Amey/Extender: Skipper Cliche in Treatment: 0 Activities of Daily Living Items Answer Activities of Daily Living (Please select one for each item) Drive Automobile Completely Able Take Medications Completely Able Use Telephone Completely Able Care for Appearance Completely Able Use Toilet Completely Able Bath / Shower Completely Able Dress Self Completely Able Feed Self Completely Able Walk Completely Able Get In / Out Bed Completely Able Housework Completely Able Prepare Meals Completely Able Handle Money Completely Able Shop for Self Completely Able Electronic Signature(s) Signed: 07/10/2021 4:36:05 PM By:  Dolan Amen RN Entered By: Dolan Amen on 07/10/2021 13:04:35 [name] ([id number]) -------------------------------------------------------------------------------- Education Screening Details Patient Name: [name], [name] Date of Service: 07/10/2021 12:45 PM Medical Record Number: [id number] Patient Account Number: [id number] Date of Birth/Sex: [birth year] (60 y.o. M) Treating RN: Dolan Amen Primary Care Dajuana Palen: PATIENT, NO Other Clinician: Referring Skip Litke: Corliss Parish Treating Josaiah Muhammed/Extender: Skipper Cliche in Treatment: 0 Primary Learner Assessed: Patient Learning Preferences/Education Level/Primary Language Learning Preference: Explanation, Demonstration Highest Education Level: College or Above Preferred Language: English Cognitive Barrier Language Barrier: No Translator Needed: No Memory Deficit: No Emotional Barrier: No Cultural/Religious Beliefs Affecting Medical Care: No Physical Barrier Impaired Vision: No Impaired Hearing: No Decreased Hand dexterity: No Knowledge/Comprehension Knowledge Level: Medium Comprehension Level: Medium Ability to understand written instructions: Medium Ability to understand verbal instructions: Medium Motivation Anxiety Level: Calm Education Importance: Acknowledges Need Interest in Health Problems: Asks Questions Perception: Coherent Willingness to Engage in Self-Management Medium Activities: Readiness to Engage in Self-Management Medium Activities: Electronic Signature(s) Signed: 07/10/2021 4:36:05 PM By: Dolan Amen RN Entered By: Dolan Amen on 07/10/2021 13:05:36 [name] ([id number]) -------------------------------------------------------------------------------- Fall Risk Assessment Details Patient Name: [name], [name] Date of Service: 07/10/2021 12:45 PM Medical Record Number: [id number] Patient Account Number: [id number] Date of Birth/Sex: 15-Jul-1961 (60 y.o.  M) Treating RN: Dolan Amen Primary Care Anhelica Fowers: PATIENT, NO Other Clinician: Referring Marilou Barnfield: Corliss Parish Treating Everett Ricciardelli/Extender: Skipper Cliche in Treatment: 0 Fall Risk Assessment Items Have you had 2 or more falls in the last 12 monthso 0 No Have you had any fall that resulted in injury in the last 12 monthso 0 No FALLS RISK SCREEN History of falling - immediate or within 3 months 0 No Secondary diagnosis (Do you have 2 or more medical diagnoseso) 0 No Ambulatory aid None/bed rest/wheelchair/nurse 0 Yes Crutches/cane/walker 0 No Furniture 0 No Intravenous therapy Access/Saline/Heparin Lock 0 No Gait/Transferring Normal/ bed rest/ wheelchair 0  Yes Weak (short steps with or without shuffle, stooped but able to lift head while walking, may 0 No seek support from furniture) Impaired (short steps with shuffle, may have difficulty arising from chair, head down, impaired 0 No balance) Mental Status Oriented to own ability 0 Yes Electronic Signature(s) Signed: 07/10/2021 4:36:05 PM By: Dolan Amen RN Entered By: Dolan Amen on 07/10/2021 13:05:58 Gregori, [name] ([id number]) -------------------------------------------------------------------------------- Foot Assessment Details Patient Name: [name], [name] Date of Service: 07/10/2021 12:45 PM Medical Record Number: [id number] Patient Account Number: [id number] Date of Birth/Sex: 1960-12-05 (60 y.o. M) Treating RN: Dolan Amen Primary Care Tinsley Everman: PATIENT, NO Other Clinician: Referring Polina Burmaster: Corliss Parish Treating Carlee Tesfaye/Extender: Skipper Cliche in Treatment: 0 Foot Assessment Items Site Locations + = Sensation present, - = Sensation absent, C = Callus, U = Ulcer R = Redness, W = Warmth, M = Maceration, PU = Pre-ulcerative lesion F = Fissure, S = Swelling, D = Dryness Assessment Right: Left: Other Deformity: No No Prior Foot Ulcer: No No Prior Amputation: No  No Charcot Joint: No No Ambulatory Status: Ambulatory Without Help Gait: Steady Electronic Signature(s) Signed: 07/10/2021 4:36:05 PM By: Dolan Amen RN Entered By: Dolan Amen on 07/10/2021 13:07:35 Swanger, [name] ([id number]) -------------------------------------------------------------------------------- Nutrition Risk Screening Details Patient Name: [name], [name] Date of Service: 07/10/2021 12:45 PM Medical Record Number: [id number] Patient Account Number: [id number] Date of Birth/Sex: July 20, 1961 (60 y.o. M) Treating RN: Dolan Amen Primary Care Townes Fuhs: PATIENT, NO Other Clinician: Referring Jahmel Flannagan: Corliss Parish Treating Jeray Shugart/Extender: Skipper Cliche in Treatment: 0 Height (in): 74 Weight (lbs): 294 Body Mass Index (BMI): 37.7 Nutrition Risk Screening Items Score Screening NUTRITION RISK SCREEN: I have an illness or condition that made me change the kind and/or amount of food I eat 0 No I eat fewer than two meals per day 0 No I eat few fruits and vegetables, or milk products 0 No I have three or more drinks of beer, liquor or wine almost every day 0 No I have tooth or mouth problems that make it hard for me to eat 0 No I don't always have enough money to buy the food I need 0 No I eat alone most of the time 0 No I take three or more different prescribed or over-the-counter drugs a day 1 Yes Without wanting to, I have lost or gained 10 pounds in the last six months 0 No I am not always physically able to shop, cook and/or feed myself 0 No Nutrition Protocols Good Risk Protocol 0 No interventions needed Moderate Risk Protocol High Risk Proctocol Risk Level: Good Risk Score: 1 Electronic Signature(s) Signed: 07/10/2021 4:36:05 PM By: Dolan Amen RN Entered By: Dolan Amen on 07/10/2021 13:06:08

## 2021-07-10 NOTE — Progress Notes (Signed)
Philip, SATTERLY ([id number]) Visit Report for 07/10/2021 Allergy List Details Patient Name: [name], [name] Date of Service: 07/10/2021 12:45 PM Medical Record Number: [id number] Patient Account Number: [id number] Date of Birth/Sex: [birth year] (60 y.o. M) Treating RN: Dolan Amen Primary Care Monique Hefty: PATIENT, NO Other Clinician: Referring Aunica Dauphinee: Corliss Parish Treating Antoine Fiallos/Extender: Skipper Cliche in Treatment: 0 Allergies Active Allergies No Known Drug Allergies Allergy Notes Electronic Signature(s) Signed: 07/10/2021 4:36:05 PM By: Dolan Amen RN Entered By: Dolan Amen on 07/10/2021 12:59:53 [name] ([id number]) -------------------------------------------------------------------------------- Arrival Information Details Patient Name: [name], [name] Date of Service: 07/10/2021 12:45 PM Medical Record Number: [id number] Patient Account Number: [id number] Date of Birth/Sex: [birth year] (59 y.o. M) Treating RN: Dolan Amen Primary Care Tayja Manzer: PATIENT, NO Other Clinician: Referring Emmilynn Marut: Corliss Parish Treating Torryn Hudspeth/Extender: Skipper Cliche in Treatment: 0 Visit Information Patient Arrived: Ambulatory Arrival Time: 12:50 Accompanied By: self Transfer Assistance: None Patient Identification Verified: Yes Secondary Verification Process Completed: Yes Electronic Signature(s) Signed: 07/10/2021 4:36:05 PM By: Dolan Amen RN Entered By: Dolan Amen on 07/10/2021 12:57:24 [name] ([id number]) -------------------------------------------------------------------------------- Clinic Level of Care Assessment Details Patient Name: [name], [name] Date of Service: 07/10/2021 12:45 PM Medical Record Number: [id number] Patient Account Number: [id number] Date of Birth/Sex: 1961-04-02 (60 y.o. M) Treating RN: Dolan Amen Primary Care Ruston Fedora: PATIENT, NO Other Clinician: Referring Alexandrya Chim: Corliss Parish Treating Destinae Neubecker/Extender: Skipper Cliche in Treatment: 0 Clinic Level of Care Assessment Items TOOL 1 Quantity Score X - Use when EandM and Procedure is performed on INITIAL visit 1 0 ASSESSMENTS - Nursing Assessment / Reassessment X - General Physical Exam (combine w/ comprehensive assessment (listed just below) when performed on new 1 20 pt. evals) X- 1 25 Comprehensive Assessment (HX, ROS, Risk Assessments, Wounds Hx, etc.) ASSESSMENTS - Wound and Skin Assessment / Reassessment '[]'$  - Dermatologic / Skin Assessment (not related to wound area) 0 ASSESSMENTS - Ostomy and/or Continence Assessment and Care '[]'$  - Incontinence Assessment and Management 0 '[]'$  - 0 Ostomy Care Assessment and Management (repouching, etc.) PROCESS - Coordination of Care X - Simple Patient / Family Education for ongoing care 1 15 '[]'$  - 0 Complex (extensive) Patient / Family Education for ongoing care X- 1 10 Staff obtains Programmer, systems, Records, Test Results / Process Orders '[]'$  - 0 Staff telephones HHA, Nursing Homes / Clarify orders / etc '[]'$  - 0 Routine Transfer to another Facility (non-emergent condition) '[]'$  - 0 Routine Hospital Admission (non-emergent condition) X- 1 15 New Admissions / Biomedical engineer / Ordering NPWT, Apligraf, etc. '[]'$  - 0 Emergency Hospital Admission (emergent condition) PROCESS - Special Needs '[]'$  - Pediatric / Minor Patient Management 0 '[]'$  - 0 Isolation Patient Management '[]'$  - 0 Hearing / Language / Visual special needs '[]'$  - 0 Assessment of Community assistance (transportation, D/C planning, etc.) '[]'$  - 0 Additional assistance / Altered mentation '[]'$  - 0 Support Surface(s) Assessment (bed, cushion, seat, etc.) INTERVENTIONS - Miscellaneous '[]'$  - External ear exam 0 '[]'$  - 0 Patient Transfer (multiple staff / Civil Service fast streamer / Similar devices) '[]'$  - 0 Simple Staple / Suture removal (25 or less) '[]'$  - 0 Complex Staple / Suture removal (26 or more) '[]'$  -  0 Hypo/Hyperglycemic Management (do not check if billed separately) X- 1 15 Ankle / Brachial Index (ABI) - do not check if billed separately Has the patient been seen at the hospital within the last three years: Yes Total Score: 100 Level Of Care: New/Established - Level 3  EZERIAH, TARPEY ([id number]) Electronic Signature(s) Signed: 07/10/2021 4:36:05 PM By: Dolan Amen RN Entered By: Dolan Amen on 07/10/2021 14:13:58 [name] ([id number]) -------------------------------------------------------------------------------- Compression Therapy Details Patient Name: [name], [name] Date of Service: 07/10/2021 12:45 PM Medical Record Number: [id number] Patient Account Number: [id number] Date of Birth/Sex: Mar 13, 1961 (60 y.o. M) Treating RN: Dolan Amen Primary Care Kinisha Soper: PATIENT, NO Other Clinician: Referring Jerrine Urschel: Corliss Parish Treating Antwion Carpenter/Extender: Skipper Cliche in Treatment: 0 Compression Therapy Performed for Wound Assessment: Wound #1 Left,Lateral Lower Leg Performed By: Clinician Dolan Amen, RN Compression Type: Four Layer Pre Treatment ABI: 0.9 Post Procedure Diagnosis Same as Pre-procedure Electronic Signature(s) Signed: 07/10/2021 4:36:05 PM By: Dolan Amen RN Entered By: Dolan Amen on 07/10/2021 Gu-Win, Cheyenne Adas ([id number]) -------------------------------------------------------------------------------- Encounter Discharge Information Details Patient Name: [name], [name] Date of Service: 07/10/2021 12:45 PM Medical Record Number: [id number] Patient Account Number: [id number] Date of Birth/Sex: 1960/11/15 (60 y.o. M) Treating RN: Dolan Amen Primary Care Ren Grasse: PATIENT, NO Other Clinician: Referring Lakeisa Heninger: Corliss Parish Treating Oswaldo Cueto/Extender: Skipper Cliche in Treatment: 0 Encounter Discharge Information Items Post Procedure Vitals Discharge Condition: Stable Temperature  (F): 98.4 Ambulatory Status: Ambulatory Pulse (bpm): 54 Discharge Destination: Home Respiratory Rate (breaths/min): 18 Transportation: Private Auto Blood Pressure (mmHg): 146/83 Accompanied By: self Schedule Follow-up Appointment: No Clinical Summary of Care: Electronic Signature(s) Signed: 07/10/2021 4:36:05 PM By: Dolan Amen RN Entered By: Dolan Amen on 07/10/2021 14:15:05 Seivert, Cheyenne Adas ([id number]) -------------------------------------------------------------------------------- Lower Extremity Assessment Details Patient Name: [name], Kipper L. Date of Service: 07/10/2021 12:45 PM Medical Record Number: [id number] Patient Account Number: [id number] Date of Birth/Sex: 1961/10/02 (60 y.o. M) Treating RN: Dolan Amen Primary Care Blaike Newburn: PATIENT, NO Other Clinician: Referring Hadley Detloff: Corliss Parish Treating Tiney Zipper/Extender: Skipper Cliche in Treatment: 0 Edema Assessment Assessed: [Left: Yes] [Right: No] Edema: [Left: Ye] [Right: s] Calf Left: Right: Point of Measurement: 40 cm From Medial Instep 45.5 cm Ankle Left: Right: Point of Measurement: 12 cm From Medial Instep 32 cm Knee To Floor Left: Right: From Medial Instep 52 cm Vascular Assessment Pulses: Dorsalis Pedis Palpable: [Left:Yes] Doppler Audible: [Left:Yes] Posterior Tibial Palpable: [Left:Yes] Doppler Audible: [Left:Yes] Blood Pressure: Brachial: [Left:122] Dorsalis Pedis: 114 Ankle: Posterior Tibial: 108 Ankle Brachial Index: [Left:0.93] Electronic Signature(s) Signed: 07/10/2021 4:36:05 PM By: Dolan Amen RN Entered By: Dolan Amen on 07/10/2021 13:24:03 Barnhard, Cheyenne Adas ([id number]) -------------------------------------------------------------------------------- Multi Wound Chart Details Patient Name: [name], Malikhi L. Date of Service: 07/10/2021 12:45 PM Medical Record Number: [id number] Patient Account Number: [id number] Date of Birth/Sex: 05/15/1961 (60 y.o.  M) Treating RN: Dolan Amen Primary Care Rosey Eide: PATIENT, NO Other Clinician: Referring Virl Coble: Corliss Parish Treating Kajsa Butrum/Extender: Skipper Cliche in Treatment: 0 Vital Signs Height(in): 74 Pulse(bpm): 54 Weight(lbs): 294 Blood Pressure(mmHg): 146/83 Body Mass Index(BMI): 38 Temperature(F): 98.4 Respiratory Rate(breaths/min): 18 Photos: [N/A:N/A] Wound Location: Left, Lateral Lower Leg N/A N/A Wounding Event: Gradually Appeared N/A N/A Primary Etiology: Venous Leg Ulcer N/A N/A Comorbid History: Sleep Apnea, Hypertension N/A N/A Date Acquired: 05/09/2021 N/A N/A Weeks of Treatment: 0 N/A N/A Wound Status: Open N/A N/A Measurements L x W x D (cm) 2.2x3.5x0.2 N/A N/A Area (cm) : 6.048 N/A N/A Volume (cm) : 1.21 N/A N/A % Reduction in Area: 0.00% N/A N/A % Reduction in Volume: 0.00% N/A N/A Classification: Full Thickness Without Exposed N/A N/A Support Structures Exudate Amount: Medium N/A N/A Exudate Type: Serosanguineous N/A N/A Exudate Color: red, brown N/A N/A Granulation Amount: Medium (34-66%) N/A  N/A Granulation Quality: Red N/A N/A Necrotic Amount: Medium (34-66%) N/A N/A Necrotic Tissue: Eschar N/A N/A Exposed Structures: Fat Layer (Subcutaneous Tissue): N/A N/A Yes Fascia: No Tendon: No Muscle: No Joint: No Bone: No Epithelialization: None N/A N/A Treatment Notes Electronic Signature(s) Signed: 07/10/2021 4:36:05 PM By: Dolan Amen RN Entered By: Dolan Amen on 07/10/2021 13:43:12 Clock, Cheyenne Adas ([id number]) -------------------------------------------------------------------------------- [place] Details Patient Name: [name], Cameryn L. Date of Service: 07/10/2021 12:45 PM Medical Record Number: [id number] Patient Account Number: [id number] Date of Birth/Sex: 09-18-1961 (60 y.o. M) Treating RN: Dolan Amen Primary Care Shauntel Prest: PATIENT, NO Other Clinician: Referring Damonie Furney: Corliss Parish Treating Wylodean Shimmel/Extender: Skipper Cliche in Treatment: 0 Active Inactive Necrotic Tissue Nursing Diagnoses: Impaired tissue integrity related to necrotic/devitalized tissue Knowledge deficit related to management of necrotic/devitalized tissue Goals: Necrotic/devitalized tissue will be minimized in the wound bed Date Initiated: 07/10/2021 Target Resolution Date: 07/10/2021 Goal Status: Active Patient/caregiver will verbalize understanding of reason and process for debridement of necrotic tissue Date Initiated: 07/10/2021 Target Resolution Date: 07/10/2021 Goal Status: Active Interventions: Assess patient pain level pre-, during and post procedure and prior to discharge Provide education on necrotic tissue and debridement process Treatment Activities: Apply topical anesthetic as ordered : 07/10/2021 Biologic debridement : 07/10/2021 Enzymatic debridement : 07/10/2021 Excisional debridement : 07/10/2021 Notes: Orientation to the Wound Care Program Nursing Diagnoses: Knowledge deficit related to the wound healing center program Goals: Patient/caregiver will verbalize understanding of the [facility] Date Initiated: 07/10/2021 Target Resolution Date: 07/10/2021 Goal Status: Active Interventions: Provide education on orientation to the wound center Notes: Wound/Skin Impairment Nursing Diagnoses: Impaired tissue integrity Goals: Patient/caregiver will verbalize understanding of skin care regimen Date Initiated: 07/10/2021 Target Resolution Date: 07/10/2021 Goal Status: Active Ulcer/skin breakdown will have a volume reduction of 30% by week 4 Date Initiated: 07/10/2021 Target Resolution Date: 08/09/2021 Goal Status: Active Ulcer/skin breakdown will have a volume reduction of 50% by week 8 JERIMEY, WEIBLEY ([id number]) Date Initiated: 07/10/2021 Target Resolution Date: 09/09/2021 Goal Status: Active Ulcer/skin breakdown will have a volume reduction of 80% by week  12 Date Initiated: 07/10/2021 Target Resolution Date: 10/09/2021 Goal Status: Active Ulcer/skin breakdown will heal within 14 weeks Date Initiated: 07/10/2021 Target Resolution Date: 11/09/2021 Goal Status: Active Interventions: Assess patient/caregiver ability to obtain necessary supplies Assess patient/caregiver ability to perform ulcer/skin care regimen upon admission and as needed Assess ulceration(s) every visit Provide education on smoking Provide education on ulcer and skin care Treatment Activities: Skin care regimen initiated : 07/10/2021 Notes: Electronic Signature(s) Signed: 07/10/2021 4:36:05 PM By: Dolan Amen RN Entered By: Dolan Amen on 07/10/2021 13:42:37 Beauregard, Cheyenne Adas ([id number]) -------------------------------------------------------------------------------- Pain Assessment Details Patient Name: [name], Farhaan L. Date of Service: 07/10/2021 12:45 PM Medical Record Number: [id number] Patient Account Number: [id number] Date of Birth/Sex: 05-Jul-1961 (60 y.o. M) Treating RN: Dolan Amen Primary Care Fernando Stoiber: PATIENT, NO Other Clinician: Referring Dorman Calderwood: Corliss Parish Treating Rhyann Berton/Extender: Skipper Cliche in Treatment: 0 Active Problems Location of Pain Severity and Description of Pain Patient Has Paino No Site Locations Rate the pain. Current Pain Level: 0 Pain Management and Medication Current Pain Management: Electronic Signature(s) Signed: 07/10/2021 4:36:05 PM By: Dolan Amen RN Entered By: Dolan Amen on 07/10/2021 12:57:30 [name] ([id number]) -------------------------------------------------------------------------------- Patient/Caregiver Education Details Patient Name: [name] Date of Service: 07/10/2021 12:45 PM Medical Record Number: [id number] Patient Account Number: [id number] Date of Birth/Gender: [birth year] (60 y.o. M) Treating RN: Dolan Amen Primary Care  Physician: PATIENT, NO Other  Clinician: Referring Physician: Corliss Parish Treating Physician/Extender: Skipper Cliche in Treatment: 0 Education Assessment Education Provided To: Patient Education Topics Provided Welcome To [facility]: Methods: Explain/Verbal Responses: State content correctly Wound Debridement: Methods: Explain/Verbal Responses: State content correctly Wound/Skin Impairment: Methods: Explain/Verbal Responses: State content correctly Electronic Signature(s) Signed: 07/10/2021 4:36:05 PM By: Dolan Amen RN Entered By: Dolan Amen on 07/10/2021 14:14:20 Sayres, Ezechiel L. ([id number]) -------------------------------------------------------------------------------- Wound Assessment Details Patient Name: [name], Adriana L. Date of Service: 07/10/2021 12:45 PM Medical Record Number: [id number] Patient Account Number: [id number] Date of Birth/Sex: 1961-09-09 (60 y.o. M) Treating RN: Dolan Amen Primary Care Mennie Spiller: PATIENT, NO Other Clinician: Referring Shriya Aker: Corliss Parish Treating Analisia Kingsford/Extender: Skipper Cliche in Treatment: 0 Wound Status Wound Number: 1 Primary Etiology: Venous Leg Ulcer Wound Location: Left, Lateral Lower Leg Wound Status: Open Wounding Event: Gradually Appeared Comorbid History: Sleep Apnea, Hypertension Date Acquired: 05/09/2021 Weeks Of Treatment: 0 Clustered Wound: No Photos Wound Measurements Length: (cm) 2.2 Width: (cm) 3.5 Depth: (cm) 0.2 Area: (cm) 6.048 Volume: (cm) 1.21 % Reduction in Area: 0% % Reduction in Volume: 0% Epithelialization: None Tunneling: No Undermining: No Wound Description Classification: Full Thickness Without Exposed Support Structu Exudate Amount: Medium Exudate Type: Serosanguineous Exudate Color: red, brown res Foul Odor After Cleansing: No Slough/Fibrino Yes Wound Bed Granulation Amount: Medium (34-66%) Exposed Structure Granulation Quality: Red Fascia Exposed: No Necrotic  Amount: Medium (34-66%) Fat Layer (Subcutaneous Tissue) Exposed: Yes Necrotic Quality: Eschar Tendon Exposed: No Muscle Exposed: No Joint Exposed: No Bone Exposed: No Treatment Notes Wound #1 (Lower Leg) Wound Laterality: Left, Lateral Cleanser Soap and Water Discharge Instruction: Gently cleanse wound with antibacterial soap, rinse and pat dry prior to dressing wounds Peri-Wound Care ROCKWELL, ZELDIN ([id number]) Topical Primary Dressing Hydrofera Blue Ready Transfer Foam, 2.5x2.5 (in/in) Discharge Instruction: Apply Hydrofera Blue Ready to wound bed as directed Secondary Dressing ABD Pad 5x9 (in/in) Discharge Instruction: Cover with ABD pad Secured With Compression Wrap Profore LF 4 Multi-Layer Compression [place] Discharge Instruction: Apply 4 multi-layer wrap as directed. Compression Stockings Add-Ons Electronic Signature(s) Signed: 07/10/2021 4:36:05 PM By: Dolan Amen RN Entered By: Dolan Amen on 07/10/2021 13:16:41 [name] ([id number]) -------------------------------------------------------------------------------- Vitals Details Patient Name: [name], Aydeen L. Date of Service: 07/10/2021 12:45 PM Medical Record Number: [id number] Patient Account Number: [id number] Date of Birth/Sex: 1960-11-03 (60 y.o. M) Treating RN: Dolan Amen Primary Care Kentaro Alewine: PATIENT, NO Other Clinician: Referring Shanell Aden: Corliss Parish Treating Dwan Fennel/Extender: Skipper Cliche in Treatment: 0 Vital Signs Time Taken: 12:57 Temperature (F): 98.4 Height (in): 74 Pulse (bpm): 54 Source: Stated Respiratory Rate (breaths/min): 18 Weight (lbs): 294 Blood Pressure (mmHg): 146/83 Source: Measured Reference Range: 80 - 120 mg / dl Body Mass Index (BMI): 37.7 Electronic Signature(s) Signed: 07/10/2021 4:36:05 PM By: Dolan Amen RN Entered By: Dolan Amen on 07/10/2021 12:59:11

## 2021-07-11 NOTE — Progress Notes (Signed)
DE, KOPKA ([id number]) Visit Report for 07/10/2021 Chief Complaint Document Details Patient Name: [name], [name]. Date of Service: 07/10/2021 12:45 PM Medical Record Number: [id number] Patient Account Number: [id number] Date of Birth/Sex: 03-31-1961 (60 y.o. M) Treating RN: Dolan Amen Primary Care Provider: PATIENT, NO Other Clinician: Referring Provider: Corliss Parish Treating Provider/Extender: Skipper Cliche in Treatment: 0 Information Obtained from: Patient Chief Complaint Left LE Ulcer Electronic Signature(s) Signed: 07/10/2021 1:40:38 PM By: Worthy Keeler PA-C Entered By: Worthy Keeler on 07/10/2021 13:40:38 Mellone, Jakorian Carlean [name] ([id number]) -------------------------------------------------------------------------------- Debridement Details Patient Name: [name], [name] Date of Service: 07/10/2021 12:45 PM Medical Record Number: [id number] Patient Account Number: [id number] Date of Birth/Sex: [birth year] (60 y.o. M) Treating RN: Dolan Amen Primary Care Provider: PATIENT, NO Other Clinician: Referring Provider: Corliss Parish Treating Provider/Extender: Skipper Cliche in Treatment: 0 Debridement Performed for Wound #1 Left,Lateral Lower Leg Assessment: Performed By: Physician Tommie Sams., PA-C Debridement Type: Debridement Severity of Tissue Pre Debridement: Fat layer exposed Level of Consciousness (Pre- Awake and Alert procedure): Pre-procedure Verification/Time Out Yes - 13:42 Taken: Start Time: 13:42 Total Area Debrided (L x W): 2.2 (cm) x 3.5 (cm) = 7.7 (cm) Tissue and other material Viable, Non-Viable, Eschar, Slough, Subcutaneous, Slough debrided: Level: Skin/Subcutaneous Tissue Debridement Description: Excisional Instrument: Curette Bleeding: Minimum Hemostasis Achieved: Pressure Response to Treatment: Procedure was tolerated well Level of Consciousness (Post- Awake and Alert procedure): Post Debridement  Measurements of Total Wound Length: (cm) 2.2 Width: (cm) 3.5 Depth: (cm) 0.2 Volume: (cm) 1.21 Character of Wound/Ulcer Post Debridement: Stable Severity of Tissue Post Debridement: Fat layer exposed Post Procedure Diagnosis Same as Pre-procedure Electronic Signature(s) Signed: 07/10/2021 4:36:05 PM By: Dolan Amen RN Signed: 07/11/2021 11:43:44 AM By: Worthy Keeler PA-C Entered By: Dolan Amen on 07/10/2021 13:45:49 Mago, [name] ([id number]) -------------------------------------------------------------------------------- HPI Details Patient Name: [name], [name] Date of Service: 07/10/2021 12:45 PM Medical Record Number: [id number] Patient Account Number: [id number] Date of Birth/Sex: 02/09/1961 (60 y.o. M) Treating RN: Dolan Amen Primary Care Provider: PATIENT, NO Other Clinician: Referring Provider: Corliss Parish Treating Provider/Extender: Skipper Cliche in Treatment: 0 History of Present Illness HPI Description: 07/10/2021 upon evaluation today patient appears to be doing somewhat poorly in regard to the wound on his left lateral lower extremity. This is a venous leg ulcer. 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 Fortunately there does not appear to be any signs of infection based on what I am seeing right now but he does have some issues noted here with some pain he has not been covering this occasionally he will put a Band-Aid and some Neosporin on but that is about it. He does have a history of hypertension and chronic venous insufficiency no other major medical problems. Electronic Signature(s) Signed: 07/11/2021 8:21:10 AM By: Worthy Keeler PA-C Entered By: Worthy Keeler on 07/11/2021 08:21:10 [name] ([id number]) -------------------------------------------------------------------------------- Physical Exam Details Patient Name: [name],  [name] Date of Service: 07/10/2021 12:45 PM Medical Record Number: [id number] Patient Account Number: [id number] Date of Birth/Sex: 1961/09/10 (60 y.o. M) Treating RN: Dolan Amen Primary Care Provider: PATIENT, NO Other Clinician: Referring Provider: Corliss Parish Treating Provider/Extender: Skipper Cliche in Treatment: 0 Constitutional patient is hypertensive.. pulse regular and within target range for patient.Marland Kitchen respirations regular, non-labored and within target range for patient.Marland Kitchen temperature within target range for patient.. Well-nourished and well-hydrated in  no acute distress. Eyes conjunctiva clear no eyelid edema noted. pupils equal round and reactive to light and accommodation. Ears, Nose, Mouth, and Throat no gross abnormality of ear auricles or external auditory canals. normal hearing noted during conversation. mucus membranes moist. Respiratory normal breathing without difficulty. Cardiovascular 2+ dorsalis pedis/posterior tibialis pulses. 2+ pitting edema of the bilateral lower extremities. Musculoskeletal normal gait and posture. no significant deformity or arthritic changes, no loss or range of motion, no clubbing. Psychiatric this patient is able to make decisions and demonstrates good insight into disease process. Alert and Oriented x 3. pleasant and cooperative. Notes Upon inspection patient's wound bed actually showed signs of necrotic tissue noted on the surface of the wound this is going require sharp debridement and debridement was performed today without complication postdebridement wound bed appears to be doing significantly better which is great news. Electronic Signature(s) Signed: 07/11/2021 9:50:35 AM By: Worthy Keeler PA-C Entered By: Worthy Keeler on 07/11/2021 09:50:35 [name] ([id number]) -------------------------------------------------------------------------------- Physician Orders Details Patient Name: Philip Rower  L. Date of Service: 07/10/2021 12:45 PM Medical Record Number: [id number] Patient Account Number: [id number] Date of Birth/Sex: [birth year] (60 y.o. M) Treating RN: Dolan Amen Primary Care Provider: PATIENT, NO Other Clinician: Referring Provider: Corliss Parish Treating Provider/Extender: Skipper Cliche in Treatment: 0 Verbal / Phone Orders: No Diagnosis Coding ICD-10 Coding Code Description I87.2 Venous insufficiency (chronic) (peripheral) L97.822 Non-pressure chronic ulcer of other part of left lower leg with fat layer exposed [place] (primary) hypertension Follow-up Appointments o Return Appointment in 2 weeks. - Nurse visit on Monday 07/14/21 o Nurse Visit as needed - Call for nurse visit IF compression wrap: 1. Gets wet 2. Slides down to about a third of the leg or 3. Gets torn or messed up [employer] o May shower with wound dressing protected with water repellent cover or cast protector. o No tub bath. Edema Control - Lymphedema / Segmental Compressive Device / Other o Elevate, Exercise Daily and Avoid Standing for Long Periods of Time. o Elevate legs to the level of the heart and pump ankles as often as possible o Elevate leg(s) parallel to the floor when sitting. Wound Treatment Wound #1 - Lower Leg Wound Laterality: Left, Lateral Cleanser: Soap and Water 1 x Per Week/30 Days Discharge Instructions: Gently cleanse wound with antibacterial soap, rinse and pat dry prior to dressing wounds Primary Dressing: Hydrofera Blue Ready Transfer Foam, 2.5x2.5 (in/in) 1 x Per Week/30 Days Discharge Instructions: Apply Hydrofera Blue Ready to wound bed as directed Secondary Dressing: ABD Pad 5x9 (in/in) 1 x Per Week/30 Days Discharge Instructions: Cover with ABD pad Compression Wrap: Profore LF 4 Multi-Layer Compression Bandaging System 1 x Per Week/30 Days Discharge Instructions: Apply 4 multi-layer wrap as directed. Electronic  Signature(s) Signed: 07/10/2021 4:36:05 PM By: Dolan Amen RN Signed: 07/11/2021 11:43:44 AM By: Worthy Keeler PA-C Entered By: Dolan Amen on 07/10/2021 14:12:05 [name] ([id number]) -------------------------------------------------------------------------------- Problem List Details Patient Name: [name], [name] Date of Service: 07/10/2021 12:45 PM Medical Record Number: [id number] Patient Account Number: [id number] Date of Birth/Sex: 1961/03/31 (60 y.o. M) Treating RN: Dolan Amen Primary Care Provider: PATIENT, NO Other Clinician: Referring Provider: Corliss Parish Treating Provider/Extender: Skipper Cliche in Treatment: 0 Active Problems ICD-10 Encounter Code Description Active Date MDM Diagnosis I87.2 Venous insufficiency (chronic) (peripheral) 07/10/2021 No Yes L97.822 Non-pressure chronic ulcer of other part of left lower leg with fat layer 07/10/2021 No Yes exposed [place] (primary)  hypertension 07/10/2021 No Yes Inactive Problems Resolved Problems Electronic Signature(s) Signed: 07/10/2021 1:40:22 PM By: Worthy Keeler PA-C Entered By: Worthy Keeler on 07/10/2021 13:40:22 [name], [name] ([id number]) -------------------------------------------------------------------------------- Progress Note Details Patient Name: [name], Johnryan L. Date of Service: 07/10/2021 12:45 PM Medical Record Number: [id number] Patient Account Number: [id number] Date of Birth/Sex: 1961-07-10 (60 y.o. M) Treating RN: Dolan Amen Primary Care Provider: PATIENT, NO Other Clinician: Referring Provider: Corliss Parish Treating Provider/Extender: Skipper Cliche in Treatment: 0 Subjective Chief Complaint Information obtained from Patient Left LE Ulcer History of Present Illness (HPI) 07/10/2021 upon evaluation today patient appears to be doing somewhat poorly in regard to the wound on his left lateral lower extremity. This is a venous leg ulcer. 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 Fortunately there does not appear to be any signs of infection based on what I am seeing right now but he does have some issues noted here with some pain he has not been covering this occasionally he will put a Band-Aid and some Neosporin on but that is about it. He does have a history of hypertension and chronic venous insufficiency no other major medical problems. Patient History Information obtained from Patient. Allergies No Known Drug Allergies Social History Current every day smoker, Alcohol Use - Moderate, Drug Use - No History, Caffeine Use - Daily. Medical History Respiratory Patient has history of Sleep Apnea Cardiovascular Patient has history of Hypertension Endocrine Denies history of Type I Diabetes, Type II Diabetes Genitourinary Denies history of End Stage Renal Disease Immunological Denies history of Lupus Erythematosus, Raynaud s, Scleroderma Integumentary (Skin) Denies history of History of Burn, History of pressure wounds Musculoskeletal Denies history of Gout, Rheumatoid Arthritis, Osteoarthritis, Osteomyelitis Neurologic Denies history of Dementia, Neuropathy, Quadriplegia, Paraplegia, Seizure Disorder Oncologic Denies history of Received Chemotherapy, Received Radiation Psychiatric Denies history of Anorexia/bulimia, Confinement Anxiety Review of Systems (ROS) Constitutional Symptoms (General Health) Denies complaints or symptoms of Fatigue, Fever, Chills, Marked Weight Change. Eyes Complains or has symptoms of Vision Changes. Denies complaints or symptoms of Dry Eyes, Glasses / Contacts. Ear/Nose/Mouth/Throat Denies complaints or symptoms of Difficult clearing ears, Sinusitis. Hematologic/Lymphatic Denies complaints or symptoms of Bleeding / Clotting Disorders, Human Immunodeficiency Virus. Respiratory Denies complaints  or symptoms of Chronic or frequent coughs, Shortness of Breath. Cardiovascular Complains or has symptoms of LE edema. Denies complaints or symptoms of Chest pain. Gastrointestinal Denies complaints or symptoms of Frequent diarrhea, Nausea, Vomiting. Endocrine Denies complaints or symptoms of Hepatitis, Thyroid disease, Polydypsia (Excessive Thirst). Genitourinary Denies complaints or symptoms of Kidney failure/ Dialysis, Incontinence/dribbling, hx of partial adrenalectomy Kinyon, Bronislaus L. ([id number]) Immunological Denies complaints or symptoms of Hives, Itching. Integumentary (Skin) Complains or has symptoms of Wounds, Swelling. Denies complaints or symptoms of Bleeding or bruising tendency, Breakdown. Musculoskeletal Denies complaints or symptoms of Muscle Pain, Muscle Weakness. Neurologic Denies complaints or symptoms of Numbness/parasthesias, Focal/Weakness. Psychiatric Denies complaints or symptoms of Anxiety, Claustrophobia. Objective Constitutional patient is hypertensive.. pulse regular and within target range for patient.Marland Kitchen respirations regular, non-labored and within target range for patient.Marland Kitchen temperature within target range for patient.. Well-nourished and well-hydrated in no acute distress. Vitals Time Taken: 12:57 PM, Height: 74 in, Source: Stated, Weight: 294 lbs, Source: Measured, BMI: 37.7, Temperature: 98.4 F, Pulse: 54 bpm, Respiratory Rate: 18 breaths/min, Blood Pressure: 146/83 mmHg. Eyes conjunctiva clear no eyelid edema noted. pupils equal round and reactive to light  and accommodation. Ears, Nose, Mouth, and Throat no gross abnormality of ear auricles or external auditory canals. normal hearing noted during conversation. mucus membranes moist. Respiratory normal breathing without difficulty. Cardiovascular 2+ dorsalis pedis/posterior tibialis pulses. 2+ pitting edema of the bilateral lower extremities. Musculoskeletal normal gait and posture. no  significant deformity or arthritic changes, no loss or range of motion, no clubbing. Psychiatric this patient is able to make decisions and demonstrates good insight into disease process. Alert and Oriented x 3. pleasant and cooperative. General Notes: Upon inspection patient's wound bed actually showed signs of necrotic tissue noted on the surface of the wound this is going require sharp debridement and debridement was performed today without complication postdebridement wound bed appears to be doing significantly better which is great news. Integumentary (Hair, Skin) Wound #1 status is Open. Original cause of wound was Gradually Appeared. The date acquired was: 05/09/2021. The wound is located on the Left,Lateral Lower Leg. The wound measures 2.2cm length x 3.5cm width x 0.2cm depth; 6.048cm^2 area and 1.21cm^3 volume. There is Fat Layer (Subcutaneous Tissue) exposed. There is no tunneling or undermining noted. There is a medium amount of serosanguineous drainage noted. There is medium (34-66%) red granulation within the wound bed. There is a medium (34-66%) amount of necrotic tissue within the wound bed including Eschar. Assessment Active Problems ICD-10 Venous insufficiency (chronic) (peripheral) Non-pressure chronic ulcer of other part of left lower leg with fat layer exposed Essential (primary) hypertension Parrillo, Brecken L. ([id number]) Procedures Wound #1 Pre-procedure diagnosis of Wound #1 is a Venous Leg Ulcer located on the Left,Lateral Lower Leg .Severity of Tissue Pre Debridement is: Fat layer exposed. There was a Excisional Skin/Subcutaneous Tissue Debridement with a total area of 7.7 sq cm performed by Tommie Sams., PA-C. With the following instrument(s): Curette to remove Viable and Non-Viable tissue/material. Material removed includes Eschar, Subcutaneous Tissue, and Slough. A time out was conducted at 13:42, prior to the start of the procedure. A Minimum amount of bleeding  was controlled with Pressure. The procedure was tolerated well. Post Debridement Measurements: 2.2cm length x 3.5cm width x 0.2cm depth; 1.21cm^3 volume. Character of Wound/Ulcer Post Debridement is stable. Severity of Tissue Post Debridement is: Fat layer exposed. Post procedure Diagnosis Wound #1: Same as Pre-Procedure Pre-procedure diagnosis of Wound #1 is a Venous Leg Ulcer located on the Left,Lateral Lower Leg . There was a Four Layer Compression Therapy Procedure with a pre-treatment ABI of 0.9 by Dolan Amen, RN. Post procedure Diagnosis Wound #1: Same as Pre-Procedure Plan Follow-up Appointments: Return Appointment in 2 weeks. - Nurse visit on Monday 07/14/21 Nurse Visit as needed - Call for nurse visit IF compression wrap: 1. Gets wet 2. Slides down to about a third of the leg or 3. Gets torn or messed up Bathing/ Shower/ Hygiene: May shower with wound dressing protected with water repellent cover or cast protector. No tub bath. Edema Control - Lymphedema / Segmental Compressive Device / Other: Elevate, Exercise Daily and Avoid Standing for Long Periods of Time. Elevate legs to the level of the heart and pump ankles as often as possible Elevate leg(s) parallel to the floor when sitting. WOUND #1: - Lower Leg Wound Laterality: Left, Lateral Cleanser: Soap and Water 1 x Per Week/30 Days Discharge Instructions: Gently cleanse wound with antibacterial soap, rinse and pat dry prior to dressing wounds Primary Dressing: Hydrofera Blue Ready Transfer Foam, 2.5x2.5 (in/in) 1 x Per Week/30 Days Discharge Instructions: Apply Hydrofera Blue Ready to wound bed as directed  Secondary Dressing: ABD Pad 5x9 (in/in) 1 x Per Week/30 Days Discharge Instructions: Cover with ABD pad Compression Wrap: Profore LF 4 Multi-Layer Compression Bandaging System 1 x Per Week/30 Days Discharge Instructions: Apply 4 multi-layer wrap as directed. 1. Would recommend currently that we go ahead and initiate  treatment today with a compression wrap. I would recommend a 4-layer compression wrap. 2. Also can I suggest we initiate treatment with Hydrofera Blue followed by an ABD pad to cover. We will see patient back for reevaluation in 1 week here in the clinic. If anything worsens or changes patient will contact our office for additional recommendations. Electronic Signature(s) Signed: 07/11/2021 9:51:08 AM By: Worthy Keeler PA-C Entered By: Worthy Keeler on 07/11/2021 09:51:08 [name] ([id number]) -------------------------------------------------------------------------------- ROS/PFSH Details Patient Name: Philip Rower L. Date of Service: 07/10/2021 12:45 PM Medical Record Number: [id number] Patient Account Number: [id number] Date of Birth/Sex: 1960/12/08 (60 y.o. M) Treating RN: Dolan Amen Primary Care Provider: PATIENT, NO Other Clinician: Referring Provider: Corliss Parish Treating Provider/Extender: Skipper Cliche in Treatment: 0 Information Obtained From Patient Constitutional Symptoms (General Health) Complaints and Symptoms: Negative for: Fatigue; Fever; Chills; Marked Weight Change Eyes Complaints and Symptoms: Positive for: Vision Changes Negative for: Dry Eyes; Glasses / Contacts Ear/Nose/Mouth/Throat Complaints and Symptoms: Negative for: Difficult clearing ears; Sinusitis Hematologic/Lymphatic Complaints and Symptoms: Negative for: Bleeding / Clotting Disorders; Human Immunodeficiency Virus Respiratory Complaints and Symptoms: Negative for: Chronic or frequent coughs; Shortness of Breath Medical History: Positive for: Sleep Apnea Cardiovascular Complaints and Symptoms: Positive for: LE edema Negative for: Chest pain Medical History: Positive for: Hypertension Gastrointestinal Complaints and Symptoms: Negative for: Frequent diarrhea; Nausea; Vomiting Endocrine Complaints and Symptoms: Negative for: Hepatitis; Thyroid disease;  Polydypsia (Excessive Thirst) Medical History: Negative for: Type I Diabetes; Type II Diabetes Genitourinary Complaints and Symptoms: Negative for: Kidney failure/ Dialysis; Incontinence/dribbling 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 ([id number]) Medical History: Negative for: End Stage Renal Disease Immunological Complaints and Symptoms: Negative for: Hives; Itching Medical History: Negative for: Lupus Erythematosus; Raynaudos; Scleroderma Integumentary (Skin) Complaints and Symptoms: Positive for: Wounds; Swelling Negative for: Bleeding or bruising tendency; Breakdown Medical History: Negative for: History of Burn; History of pressure wounds Musculoskeletal Complaints and Symptoms: Negative for: Muscle Pain; Muscle Weakness Medical History: Negative for: Gout; Rheumatoid Arthritis; Osteoarthritis; Osteomyelitis Neurologic Complaints and Symptoms: Negative for: Numbness/parasthesias; Focal/Weakness Medical History: Negative for: Dementia; Neuropathy; Quadriplegia; Paraplegia; Seizure Disorder Psychiatric Complaints and Symptoms: Negative for: Anxiety; Claustrophobia Medical History: Negative for: Anorexia/bulimia; Confinement Anxiety Oncologic Medical History: Negative for: Received Chemotherapy; Received Radiation Immunizations Pneumococcal Vaccine: Received Pneumococcal Vaccination: No Implantable Devices None Family and Social History Current every day smoker; Alcohol Use: Moderate; Drug Use: No History; Caffeine Use: Daily Electronic Signature(s) Signed: 07/10/2021 4:36:05 PM By: Dolan Amen RN Signed: 07/11/2021 11:43:44 AM By: Worthy Keeler PA-C Entered By: Dolan Amen on 07/10/2021 13:04:09 [name], [name] ([id number]) -------------------------------------------------------------------------------- SuperBill Details Patient Name: PONT, Petros L. Date of Service: 07/10/2021 Medical Record Number:  [id number] Patient Account Number: [id number] Date of Birth/Sex: 1961-01-13 (60 y.o. M) Treating RN: Dolan Amen Primary Care Provider: PATIENT, NO Other Clinician: Referring Provider: Corliss Parish Treating Provider/Extender: Skipper Cliche in Treatment: 0 Diagnosis Coding ICD-10 Codes Code Description I87.2 Venous insufficiency (chronic) (peripheral) L97.822 Non-pressure chronic ulcer of other part of left lower leg with fat layer exposed [place] (primary) hypertension Facility Procedures CPT4 Code: [id number] Description: 99213 - WOUND CARE VISIT-LEV 3 EST PT Modifier: Quantity:  1 CPT4 Code: [id number] Description: [place] TISSUE 20 SQ CM/< Modifier: Quantity: 1 CPT4 Code: Description: ICD-10 Diagnosis Description L97.822 Non-pressure chronic ulcer of other part of left lower leg with fat layer Modifier: exposed Quantity: Physician Procedures CPT4 Code: [id number] Description: [id number] - WC PHYS LEVEL 4 - NEW PT Modifier: 25 Quantity: 1 CPT4 Code: Description: ICD-10 Diagnosis Description I87.2 Venous insufficiency (chronic) (peripheral) L97.822 Non-pressure chronic ulcer of other part of left lower leg with fat layer I10 Essential (primary) hypertension Modifier: exposed Quantity: CPT4 Code: [id number] Description: [id number] - WC PHYS SUBQ TISS 20 SQ CM Modifier: Quantity: 1 CPT4 Code: Description: ICD-10 Diagnosis Description L97.822 Non-pressure chronic ulcer of other part of left lower leg with fat layer Modifier: exposed Quantity: Electronic Signature(s) Signed: 07/11/2021 9:51:28 AM By: Worthy Keeler PA-C Previous Signature: 07/10/2021 4:36:05 PM Version By: Dolan Amen RN Entered By: Worthy Keeler on 07/11/2021 09:51:28

## 2021-07-14 ENCOUNTER — Other Ambulatory Visit: Payer: Self-pay

## 2021-07-14 DIAGNOSIS — L97822 Non-pressure chronic ulcer of other part of left lower leg with fat layer exposed: Secondary | ICD-10-CM | POA: Diagnosis not present

## 2021-07-16 NOTE — Progress Notes (Signed)
COLE, FURRH ([id number]) Visit Report for 07/14/2021 Arrival Information Details Patient Name: [name], [name]. Date of Service: 07/14/2021 12:45 PM Medical Record Number: [id number] Patient Account Number: [id number] Date of Birth/Sex: Aug 08, 1961 (60 y.o. M) Treating RN: Carlene Coria Primary Care Vasil Juhasz: PATIENT, NO Other Clinician: Referring Oluwadamilola Deliz: Rosalin Hawking Treating Horst Ostermiller/Extender: Skipper Cliche in Treatment: 0 Visit Information History Since Last Visit All ordered tests and consults were completed: No Patient Arrived: Ambulatory Added or deleted any medications: No Arrival Time: 12:54 Any new allergies or adverse reactions: No Accompanied By: self Had a fall or experienced change in No Transfer Assistance: None activities of daily living that may affect Patient Identification Verified: Yes risk of falls: Secondary Verification Process Completed: Yes Signs or symptoms of abuse/neglect since last visito No Patient Requires Transmission-Based Precautions: No Hospitalized since last visit: No Patient Has Alerts: No Implantable device outside of the clinic excluding No cellular tissue based products placed in the center since last visit: Has Dressing in Place as Prescribed: Yes Has Compression in Place as Prescribed: Yes Pain Present Now: No Electronic Signature(s) Signed: 07/16/2021 7:54:12 AM By: Carlene Coria RN Entered By: Carlene Coria on 07/14/2021 12:57:35 Bernier, Jan Carlean Jews ([id number]) -------------------------------------------------------------------------------- Clinic Level of Care Assessment Details Patient Name: Philip Rower L. Date of Service: 07/14/2021 12:45 PM Medical Record Number: [id number] Patient Account Number: [id number] Date of Birth/Sex: 11/04/1960 (60 y.o. M) Treating RN: Carlene Coria Primary Care Oneill Bais: PATIENT, NO Other Clinician: Referring Ellsworth Waldschmidt: Rosalin Hawking Treating Kohen Reither/Extender: Skipper Cliche in Treatment: 0 Clinic Level of Care Assessment Items TOOL 1 Quantity Score '[]'$  - Use when EandM and Procedure is performed on INITIAL visit 0 ASSESSMENTS - Nursing Assessment / Reassessment '[]'$  - General Physical Exam (combine w/ comprehensive assessment (listed just below) when performed on new 0 pt. evals) '[]'$  - 0 Comprehensive Assessment (HX, ROS, Risk Assessments, Wounds Hx, etc.) ASSESSMENTS - Wound and Skin Assessment / Reassessment '[]'$  - Dermatologic / Skin Assessment (not related to wound area) 0 ASSESSMENTS - Ostomy and/or Continence Assessment and Care '[]'$  - Incontinence Assessment and Management 0 '[]'$  - 0 Ostomy Care Assessment and Management (repouching, etc.) PROCESS - Coordination of Care '[]'$  - Simple Patient / Family Education for ongoing care 0 '[]'$  - 0 Complex (extensive) Patient / Family Education for ongoing care '[]'$  - 0 Staff obtains Programmer, systems, Records, Test Results / Process Orders '[]'$  - 0 Staff telephones HHA, Nursing Homes / Clarify orders / etc '[]'$  - 0 Routine Transfer to another Facility (non-emergent condition) '[]'$  - 0 Routine Hospital Admission (non-emergent condition) '[]'$  - 0 New Admissions / Biomedical engineer / Ordering NPWT, Apligraf, etc. '[]'$  - 0 Emergency Hospital Admission (emergent condition) PROCESS - Special Needs '[]'$  - Pediatric / Minor Patient Management 0 '[]'$  - 0 Isolation Patient Management '[]'$  - 0 Hearing / Language / Visual special needs '[]'$  - 0 Assessment of Community assistance (transportation, D/C planning, etc.) '[]'$  - 0 Additional assistance / Altered mentation '[]'$  - 0 Support Surface(s) Assessment (bed, cushion, seat, etc.) INTERVENTIONS - Miscellaneous '[]'$  - External ear exam 0 '[]'$  - 0 Patient Transfer (multiple staff / Civil Service fast streamer / Similar devices) '[]'$  - 0 Simple Staple / Suture removal (25 or less) '[]'$  - 0 Complex Staple / Suture removal (26 or more) '[]'$  - 0 Hypo/Hyperglycemic Management (do not check if billed  separately) '[]'$  - 0 Ankle / Brachial Index (ABI) - do not check if billed separately Has the patient been seen at the hospital within the last  three years: Yes Total Score: 0 Level Of Care: ____ [name] ([id number]) Electronic Signature(s) Signed: 07/16/2021 7:54:12 AM By: Carlene Coria RN Entered By: Carlene Coria on 07/14/2021 13:00:17 [name] ([id number]) -------------------------------------------------------------------------------- Compression Therapy Details Patient Name: [name], [name] Date of Service: 07/14/2021 12:45 PM Medical Record Number: [id number] Patient Account Number: [id number] Date of Birth/Sex: 25-Aug-1961 (60 y.o. M) Treating RN: Carlene Coria Primary Care Sherae Santino: PATIENT, NO Other Clinician: Referring Aunesti Pellegrino: Rosalin Hawking Treating Cache Bills/Extender: Skipper Cliche in Treatment: 0 Compression Therapy Performed for Wound Assessment: Wound #1 Left,Lateral Lower Leg Performed By: Clinician Carlene Coria, RN Electronic Signature(s) Signed: 07/16/2021 7:54:12 AM By: Carlene Coria RN Entered By: Carlene Coria on 07/14/2021 12:58:41 [name], [name] ([id number]) -------------------------------------------------------------------------------- Encounter Discharge Information Details Patient Name: [name], [name] Date of Service: 07/14/2021 12:45 PM Medical Record Number: [id number] Patient Account Number: [id number] Date of Birth/Sex: [birth year] (60 y.o. M) Treating RN: Carlene Coria Primary Care Breta Demedeiros: PATIENT, NO Other Clinician: Referring Kendria Halberg: Rosalin Hawking Treating Chizar[place] Latino/Extender: Skipper Cliche in Treatment: 0 Encounter Discharge Information Items Discharge Condition: Stable Ambulatory Status: Ambulatory Discharge Destination: Home Transportation: Private Auto Accompanied By: self Schedule Follow-up Appointment: Yes Clinical Summary of Care: Patient Declined Electronic Signature(s) Signed: 07/16/2021  7:54:12 AM By: Carlene Coria RN Entered By: Carlene Coria on 07/14/2021 13:00:05 [name] ([id number]) -------------------------------------------------------------------------------- Wound Assessment Details Patient Name: [name], [name] Date of Service: 07/14/2021 12:45 PM Medical Record Number: [id number] Patient Account Number: [id number] Date of Birth/Sex: 06-Dec-1960 (60 y.o. M) Treating RN: Carlene Coria Primary Care Yvonne Stopher: PATIENT, NO Other Clinician: Referring Jacquie Lukes: Rosalin Hawking Treating Tamitha Norell/Extender: Skipper Cliche in Treatment: 0 Wound Status Wound Number: 1 Primary Etiology: Venous Leg Ulcer Wound Location: Left, Lateral Lower Leg Wound Status: Open Wounding Event: Gradually Appeared Comorbid History: Sleep Apnea, Hypertension Date Acquired: 05/09/2021 Weeks Of Treatment: 0 Clustered Wound: No Wound Measurements Length: (cm) 2.2 Width: (cm) 3.5 Depth: (cm) 0.2 Area: (cm) 6.048 Volume: (cm) 1.21 % Reduction in Area: 0% % Reduction in Volume: 0% Epithelialization: None Tunneling: No Undermining: No Wound Description Classification: Full Thickness Without Exposed Support Structu Exudate Amount: Medium Exudate Type: Serosanguineous Exudate Color: red, brown res Foul Odor After Cleansing: No Slough/Fibrino Yes Wound Bed Granulation Amount: Medium (34-66%) Exposed Structure Granulation Quality: Red Fascia Exposed: No Necrotic Amount: Medium (34-66%) Fat Layer (Subcutaneous Tissue) Exposed: Yes Necrotic Quality: Eschar Tendon Exposed: No Muscle Exposed: No Joint Exposed: No Bone Exposed: No Treatment Notes Wound #1 (Lower Leg) Wound Laterality: Left, Lateral Cleanser Soap and Water Discharge Instruction: Gently cleanse wound with antibacterial soap, rinse and pat dry prior to dressing wounds Peri-Wound Care Topical Primary Dressing Hydrofera Blue Ready Transfer Foam, 2.5x2.5 (in/in) Discharge Instruction: Apply Hydrofera  Blue Ready to wound bed as directed Secondary Dressing ABD Pad 5x9 (in/in) Discharge Instruction: Cover with ABD pad Secured With Compression Wrap Profore LF 4 Multi-Layer Compression [place] Discharge Instruction: Apply 4 multi-layer wrap as directed. [name], [name] ([id number]) Compression Stockings Add-Ons Electronic Signature(s) Signed: 07/16/2021 7:54:12 AM By: Carlene Coria RN Entered By: Carlene Coria on 07/14/2021 12:58:20

## 2021-07-21 ENCOUNTER — Encounter: Payer: BC Managed Care – PPO | Admitting: Physician Assistant

## 2021-07-21 ENCOUNTER — Other Ambulatory Visit: Payer: Self-pay

## 2021-07-21 DIAGNOSIS — L97822 Non-pressure chronic ulcer of other part of left lower leg with fat layer exposed: Secondary | ICD-10-CM | POA: Diagnosis not present

## 2021-07-21 NOTE — Progress Notes (Addendum)
[name] ([id number]) Visit Report for 07/21/2021 Chief Complaint Document Details Patient Name: [name], [name]. Date of Service: 07/21/2021 8:15 AM Medical Record Number: [id number] Patient Account Number: [id number] Date of Birth/Sex: 1961-04-07 (60 y.o. M) Treating RN: Dolan Amen Primary Care Provider: PATIENT, NO Other Clinician: Referring Provider: Rosalin Hawking Treating Provider/Extender: Skipper Cliche in Treatment: 1 Information Obtained from: Patient Chief Complaint Left LE Ulcer Electronic Signature(s) Signed: 07/21/2021 8:21:50 AM By: Worthy Keeler PA-C Entered By: Worthy Keeler on 07/21/2021 08:21:50 [name] ([id number]) -------------------------------------------------------------------------------- Debridement Details Patient Name: [name], [name] Date of Service: 07/21/2021 8:15 AM Medical Record Number: [id number] Patient Account Number: [id number] Date of Birth/Sex: [birth year] (60 y.o. M) Treating RN: Dolan Amen Primary Care Provider: PATIENT, NO Other Clinician: Referring Provider: Rosalin Hawking Treating Provider/Extender: Skipper Cliche in Treatment: 1 Debridement Performed for Wound #1 Left,Lateral Lower Leg Assessment: Performed By: Physician Tommie Sams., PA-C Debridement Type: Debridement Severity of Tissue Pre Debridement: Fat layer exposed Level of Consciousness (Pre- Awake and Alert procedure): Pre-procedure Verification/Time Out Yes - 08:48 Taken: Total Area Debrided (L x W): 2 (cm) x 4 (cm) = 8 (cm) Tissue and other material Viable, Non-Viable, Slough, Subcutaneous, Skin: Epidermis, Slough debrided: Level: Skin/Subcutaneous Tissue Debridement Description: Excisional Instrument: Curette Bleeding: Moderate Hemostasis Achieved: Pressure Response to Treatment: Procedure was tolerated well Level of Consciousness (Post- Awake and Alert procedure): Post Debridement Measurements of Total  Wound Length: (cm) 2 Width: (cm) 4 Depth: (cm) 0.2 Volume: (cm) 1.257 Character of Wound/Ulcer Post Debridement: Stable Severity of Tissue Post Debridement: Fat layer exposed Post Procedure Diagnosis Same as Pre-procedure Electronic Signature(s) Signed: 07/21/2021 4:52:51 PM By: Dolan Amen RN Signed: 07/21/2021 6:26:07 PM By: Worthy Keeler PA-C Entered By: Dolan Amen on 07/21/2021 08:53:05 [name], [name] ([id number]) -------------------------------------------------------------------------------- HPI Details Patient Name: [name], [name] Date of Service: 07/21/2021 8:15 AM Medical Record Number: [id number] Patient Account Number: [id number] Date of Birth/Sex: 02-08-1961 (60 y.o. M) Treating RN: Dolan Amen Primary Care Provider: PATIENT, NO Other Clinician: Referring Provider: Rosalin Hawking Treating Provider/Extender: Skipper Cliche in Treatment: 1 History of Present Illness HPI Description: 07/10/2021 upon evaluation today patient appears to be doing somewhat poorly in regard to the wound on his left lateral lower extremity. This is a venous leg ulcer. 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 Fortunately there does not appear to be any signs of infection based on what I am seeing right now but he does have some issues noted here with some pain he has not been covering this occasionally he will put a Band-Aid and some Neosporin on but that is about it. He does have a history of hypertension and chronic venous insufficiency no other major medical problems. 07/21/2021 upon evaluation today patient appears to be doing decently well in regard to his leg ulcer. There is a lot of dry skin around the edges of the wound I am can have to perform sharp debridement to clear this away today. Overall however otherwise I think that he is actually making good progress here and I  am very pleased with where things stand. No fevers, chills, nausea, vomiting, or diarrhea. Electronic Signature(s) Signed: 07/21/2021 10:41:46 AM By: Worthy Keeler PA-C Entered By: Worthy Keeler on 07/21/2021 10:41:46 [name] ([id number]) -------------------------------------------------------------------------------- Physical Exam Details Patient Name: [name], [name] Date of Service: 07/21/2021 8:15  AM Medical Record Number: [id number] Patient Account Number: [id number] Date of Birth/Sex: 1961-05-16 (60 y.o. M) Treating RN: Dolan Amen Primary Care Provider: PATIENT, NO Other Clinician: Referring Provider: Rosalin Hawking Treating Provider/Extender: Skipper Cliche in Treatment: 1 Constitutional Well-nourished and well-hydrated in no acute distress. Respiratory normal breathing without difficulty. Psychiatric this patient is able to make decisions and demonstrates good insight into disease process. Alert and Oriented x 3. pleasant and cooperative. Notes Patient's wound bed actually showed signs of need of some sharp debridement clear away the necrotic debris he actually tolerated that today without complication and postdebridement the wound bed actually appears to be doing significantly better. With that being said even after 1 week it still is early to tell whether or not this is on a trajectory towards better healing or not. Obviously I think it is definitely better than it was last week. Nonetheless I do think compression is can be a key factor here something he still needs to have. Subsequently also discussed with the patient that I know he is going out of town to a beach trip nonetheless I think he needs to try as much as possible to keep this area clean and well protected. Also think that he needs to ensure that he does not get any salt water in the wound whatsoever that could be detrimental causing significant infection. Electronic Signature(s) Signed:  07/21/2021 6:24:38 PM By: Worthy Keeler PA-C Entered By: Worthy Keeler on 07/21/2021 18:24:38 [name] ([id number]) -------------------------------------------------------------------------------- Physician Orders Details Patient Name: [name] L. Date of Service: 07/21/2021 8:15 AM Medical Record Number: [id number] Patient Account Number: [id number] Date of Birth/Sex: [birth year] (60 y.o. M) Treating RN: Dolan Amen Primary Care Provider: PATIENT, NO Other Clinician: Referring Provider: Rosalin Hawking Treating Provider/Extender: Skipper Cliche in Treatment: 1 Verbal / Phone Orders: No Diagnosis Coding ICD-10 Coding Code Description I87.2 Venous insufficiency (chronic) (peripheral) L97.822 Non-pressure chronic ulcer of other part of left lower leg with fat layer exposed [place] (primary) hypertension Follow-up Appointments o Return Appointment in 2 weeks. Bathing/ Shower/ Hygiene o May shower; gently cleanse wound with antibacterial soap, rinse and pat dry prior to dressing wounds Edema Control - Lymphedema / Segmental Compressive Device / Other o Elevate, Exercise Daily and Avoid Standing for Long Periods of Time. o Elevate legs to the level of the heart and pump ankles as often as possible o Elevate leg(s) parallel to the floor when sitting. Wound Treatment Wound #1 - Lower Leg Wound Laterality: Left, Lateral Cleanser: Soap and Water 3 x Per Week/30 Days Discharge Instructions: Gently cleanse wound with antibacterial soap, rinse and pat dry prior to dressing wounds Primary Dressing: Hydrofera Blue Ready Transfer Foam, 2.5x2.5 (in/in) (DME) (Generic) 3 x Per Week/30 Days Discharge Instructions: Apply Hydrofera Blue Ready to wound bed as directed Secondary Dressing: [place] Dressing, 4x4 (in/in) (DME) (Generic) 3 x Per Week/30 Days Discharge Instructions: Apply over dressing to secure in place. Electronic  Signature(s) Signed: 07/21/2021 4:52:51 PM By: Dolan Amen RN Signed: 07/21/2021 6:26:07 PM By: Worthy Keeler PA-C Entered By: Dolan Amen on 07/21/2021 08:57:06 Reeves, [name] ([id number]) -------------------------------------------------------------------------------- Problem List Details Patient Name: ABLE, [name] Date of Service: 07/21/2021 8:15 AM Medical Record Number: [id number] Patient Account Number: [id number] Date of Birth/Sex: 02-Oct-1961 (61 y.o. M) Treating RN: Dolan Amen Primary Care Provider: PATIENT, NO Other Clinician: Referring Provider: Rosalin Hawking Treating Provider/Extender: Skipper Cliche in Treatment: 1 Active Problems ICD-10 Encounter Code Description Active  Date MDM Diagnosis I87.2 Venous insufficiency (chronic) (peripheral) 07/10/2021 No Yes L97.822 Non-pressure chronic ulcer of other part of left lower leg with fat layer 07/10/2021 No Yes exposed [place] (primary) hypertension 07/10/2021 No Yes Inactive Problems Resolved Problems Electronic Signature(s) Signed: 07/21/2021 8:21:44 AM By: Worthy Keeler PA-C Entered By: Worthy Keeler on 07/21/2021 08:21:44 Biehl, [name] ([id number]) -------------------------------------------------------------------------------- Progress Note Details Patient Name: [name], [name] Date of Service: 07/21/2021 8:15 AM Medical Record Number: [id number] Patient Account Number: [id number] Date of Birth/Sex: [birth year] (60 y.o. M) Treating RN: Dolan Amen Primary Care Provider: PATIENT, NO Other Clinician: Referring Provider: Rosalin Hawking Treating Provider/Extender: Skipper Cliche in Treatment: 1 Subjective Chief Complaint Information obtained from Patient Left LE Ulcer History of Present Illness (HPI) 07/10/2021 upon evaluation today patient appears to be doing somewhat poorly in regard to the wound on his left lateral lower extremity. This is a venous leg ulcer. 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 Fortunately there does not appear to be any signs of infection based on what I am seeing right now but he does have some issues noted here with some pain he has not been covering this occasionally he will put a Band-Aid and some Neosporin on but that is about it. He does have a history of hypertension and chronic venous insufficiency no other major medical problems. 07/21/2021 upon evaluation today patient appears to be doing decently well in regard to his leg ulcer. There is a lot of dry skin around the edges of the wound I am can have to perform sharp debridement to clear this away today. Overall however otherwise I think that he is actually making good progress here and I am very pleased with where things stand. No fevers, chills, nausea, vomiting, or diarrhea. Objective Constitutional Well-nourished and well-hydrated in no acute distress. Vitals Time Taken: 8:21 AM, Height: 74 in, Weight: 294 lbs, BMI: 37.7, Temperature: 98.3 F, Pulse: 55 bpm, Respiratory Rate: 18 breaths/min, Blood Pressure: 151/80 mmHg. Respiratory normal breathing without difficulty. Psychiatric this patient is able to make decisions and demonstrates good insight into disease process. Alert and Oriented x 3. pleasant and cooperative. General Notes: Patient's wound bed actually showed signs of need of some sharp debridement clear away the necrotic debris he actually tolerated that today without complication and postdebridement the wound bed actually appears to be doing significantly better. With that being said even after 1 week it still is early to tell whether or not this is on a trajectory towards better healing or not. Obviously I think it is definitely better than it was last week. Nonetheless I do think compression is can be a key factor here something he still needs to have.  Subsequently also discussed with the patient that I know he is going out of town to a beach trip nonetheless I think he needs to try as much as possible to keep this area clean and well protected. Also think that he needs to ensure that he does not get any salt water in the wound whatsoever that could be detrimental causing significant infection. Integumentary (Hair, Skin) Wound #1 status is Open. Original cause of wound was Gradually Appeared. The date acquired was: 05/09/2021. The wound has been in treatment 1 weeks. The wound is located on the Left,Lateral Lower Leg. The wound measures 2cm length x 2cm width x 0.2cm depth; 3.142cm^2 area and 0.628cm^3  volume. There is Fat Layer (Subcutaneous Tissue) exposed. There is no tunneling or undermining noted. There is a medium amount of serosanguineous drainage noted. There is large (67-100%) red, pink granulation within the wound bed. There is a small (1-33%) amount of necrotic tissue within the wound bed including Adherent Slough. Assessment Active Problems ICD-10 [name], ABREU. ([id number]) Venous insufficiency (chronic) (peripheral) Non-pressure chronic ulcer of other part of left lower leg with fat layer exposed [place] (primary) hypertension Procedures Wound #1 Pre-procedure diagnosis of Wound #1 is a Venous Leg Ulcer located on the Left,Lateral Lower Leg .Severity of Tissue Pre Debridement is: Fat layer exposed. There was a Excisional Skin/Subcutaneous Tissue Debridement with a total area of 8 sq cm performed by Tommie Sams., PA-C. With the following instrument(s): Curette to remove Viable and Non-Viable tissue/material. Material removed includes Subcutaneous Tissue, Slough, and Skin: Epidermis. A time out was conducted at 08:48, prior to the start of the procedure. A Moderate amount of bleeding was controlled with Pressure. The procedure was tolerated well. Post Debridement Measurements: 2cm length x 4cm width x 0.2cm depth;  1.257cm^3 volume. Character of Wound/Ulcer Post Debridement is stable. Severity of Tissue Post Debridement is: Fat layer exposed. Post procedure Diagnosis Wound #1: Same as Pre-Procedure Plan Follow-up Appointments: Return Appointment in 2 weeks. Bathing/ Shower/ Hygiene: May shower; gently cleanse wound with antibacterial soap, rinse and pat dry prior to dressing wounds Edema Control - Lymphedema / Segmental Compressive Device / Other: Elevate, Exercise Daily and Avoid Standing for Long Periods of Time. Elevate legs to the level of the heart and pump ankles as often as possible Elevate leg(s) parallel to the floor when sitting. WOUND #1: - Lower Leg Wound Laterality: Left, Lateral Cleanser: Soap and Water 3 x Per Week/30 Days Discharge Instructions: Gently cleanse wound with antibacterial soap, rinse and pat dry prior to dressing wounds Primary Dressing: Hydrofera Blue Ready Transfer Foam, 2.5x2.5 (in/in) (DME) (Generic) 3 x Per Week/30 Days Discharge Instructions: Apply Hydrofera Blue Ready to wound bed as directed Secondary Dressing: [place] Dressing, 4x4 (in/in) (DME) (Generic) 3 x Per Week/30 Days Discharge Instructions: Apply over dressing to secure in place. 1. We had a fairly long discussion as far as what to do about his leg and decompression needed. In the end he wanted to try to be able to change the dressings himself and get compression socks but I definitely allow him to do this especially of the next 2 weeks since he will be on vacation at the beach. 2. I am also can recommend at this time that we have the patient continue to monitor for any signs of worsening or infection. Obviously if he has any issues specifically in regard to redness, erythema, or red streaks he should let me know ASAP. 3. He does need to be wearing compression so if he is going to do compression socks that he should be on at all times during the day except for when he takes these off at  bedtime. We will see patient back for reevaluation in 2 weeks here in the clinic. If anything worsens or changes patient will contact our office for additional recommendations. Electronic Signature(s) Signed: 07/21/2021 6:25:34 PM By: Worthy Keeler PA-C Entered By: Worthy Keeler on 07/21/2021 18:25:34 [name], [name] ([id number]) -------------------------------------------------------------------------------- SuperBill Details Patient Name: [name] L. Date of Service: 07/21/2021 Medical Record Number: [id number] Patient Account Number: [id number] Date of Birth/Sex: [birth year] (60 y.o. M) Treating RN: Dolan Amen Primary Care Provider: PATIENT,  NO Other Clinician: Referring Provider: Rosalin Hawking Treating Provider/Extender: Skipper Cliche in Treatment: 1 Diagnosis Coding ICD-10 Codes Code Description I87.2 Venous insufficiency (chronic) (peripheral) L97.822 Non-pressure chronic ulcer of other part of left lower leg with fat layer exposed [place] (primary) hypertension Facility Procedures CPT4 Code: [id number] Description: 72158 - DEB SUBQ TISSUE 20 SQ CM/< Modifier: Quantity: 1 CPT4 Code: Description: ICD-10 Diagnosis Description L97.822 Non-pressure chronic ulcer of other part of left lower leg with fat layer Modifier: exposed Quantity: Physician Procedures CPT4 Code: [id number] Description: 85927 - WC PHYS SUBQ TISS 20 SQ CM Modifier: Quantity: 1 CPT4 Code: Description: ICD-10 Diagnosis Description L97.822 Non-pressure chronic ulcer of other part of left lower leg with fat layer Modifier: exposed Quantity: Electronic Signature(s) Signed: 07/21/2021 6:25:46 PM By: Worthy Keeler PA-C Previous Signature: 07/21/2021 4:52:51 PM Version By: Dolan Amen RN Entered By: Worthy Keeler on 07/21/2021 18:25:46

## 2021-07-22 NOTE — Progress Notes (Signed)
[name], [name] ([id number]) Visit Report for 07/21/2021 Arrival Information Details Patient Name: [name], [name]. Date of Service: 07/21/2021 8:15 AM Medical Record Number: [id number] Patient Account Number: [id number] Date of Birth/Sex: 1961-02-10 (60 y.o. M) Treating RN: [name] Primary Care [name]: PATIENT, NO Other Clinician: Referring [name]: [name] Treating [name]/Extender: [name] in [name]: 1 Visit Information History Since Last Visit Pain Present Now: No Patient Arrived: Ambulatory Arrival Time: 08:19 Accompanied By: self Transfer Assistance: None Patient Identification Verified: Yes Secondary Verification Process Completed: Yes Patient Requires Transmission-Based Precautions: No Patient Has Alerts: No Electronic Signature(s) Signed: 07/21/2021 4:52:51 PM By: Philip Amen RN Entered By: [name] on 07/21/2021 08:20:16 [name] ([id number]) -------------------------------------------------------------------------------- Clinic Level of Care Assessment Details Patient Name: [name], [name] Date of Service: 07/21/2021 8:15 AM Medical Record Number: [id number] Patient Account Number: [id number] Date of Birth/Sex: 09-16-1961 (60 y.o. M) Treating RN: [name] Primary Care Zola Runion: PATIENT, NO Other Clinician: Referring [name]: [name] Treating [name]/Extender: [name] in [name]: 1 Clinic Level of Care Assessment Items TOOL 1 Quantity Score []  - Use when EandM and Procedure is performed on INITIAL visit 0 ASSESSMENTS - Nursing Assessment / Reassessment []  - General Physical Exam (combine w/ comprehensive assessment (listed just below) when performed on new 0 pt. evals) []  - 0 Comprehensive Assessment (HX, ROS, Risk Assessments, Wounds Hx, etc.) ASSESSMENTS - Wound and Skin Assessment / Reassessment []  - Dermatologic / Skin Assessment (not related to wound area)  0 ASSESSMENTS - Ostomy and/or Continence Assessment and Care []  - Incontinence Assessment and Management 0 []  - 0 Ostomy Care Assessment and Management (repouching, etc.) PROCESS - Coordination of Care []  - Simple Patient / Family Education for ongoing care 0 []  - 0 Complex (extensive) Patient / Family Education for ongoing care []  - 0 Staff obtains Programmer, systems, Records, Test Results / Process Orders []  - 0 Staff telephones HHA, Nursing Homes / Clarify orders / etc []  - 0 Routine Transfer to another Facility (non-emergent condition) []  - 0 Routine Hospital Admission (non-emergent condition) []  - 0 New Admissions / Biomedical engineer / Ordering NPWT, Apligraf, etc. []  - 0 Emergency Hospital Admission (emergent condition) PROCESS - Special Needs []  - Pediatric / Minor Patient Management 0 []  - 0 Isolation Patient Management []  - 0 Hearing / Language / Visual special needs []  - 0 Assessment of Community assistance (transportation, D/C planning, etc.) []  - 0 Additional assistance / Altered mentation []  - 0 Support Surface(s) Assessment (bed, cushion, seat, etc.) INTERVENTIONS - Miscellaneous []  - External ear exam 0 []  - 0 Patient Transfer (multiple staff / Civil Service fast streamer / Similar devices) []  - 0 Simple Staple / Suture removal (25 or less) []  - 0 Complex Staple / Suture removal (26 or more) []  - 0 Hypo/Hyperglycemic Management (do not check if billed separately) []  - 0 Ankle / Brachial Index (ABI) - do not check if billed separately Has the patient been seen at the hospital within the last three years: Yes Total Score: 0 Level Of Care: ____ [name] ([id number]) Electronic Signature(s) Signed: 07/21/2021 4:52:51 PM By: Philip Amen RN Entered By: [name] on 07/21/2021 09:05:43 [name] ([id number]) -------------------------------------------------------------------------------- Encounter Discharge Information Details Patient Name:  [name], [name] Date of Service: 07/21/2021 8:15 AM Medical Record Number: [id number] Patient Account Number: [id number] Date of Birth/Sex: 05/11/1961 (60 y.o. M) Treating RN: [name] Primary Care Lashawn Bromwell: PATIENT, NO Other Clinician: Referring Ellanie Oppedisano: [name] Treating  [name]/Extender: [name]: 1 Encounter Discharge Information Items Post Procedure Vitals Discharge Condition: Stable Temperature (F): 98.3 Ambulatory Status: Ambulatory Pulse (bpm): 55 Discharge Destination: Home Respiratory Rate (breaths/min): 18 Transportation: Private Auto Blood Pressure (mmHg): 151/80 Accompanied By: self Schedule Follow-up Appointment: Yes Clinical Summary of Care: Electronic Signature(s) Signed: 07/21/2021 4:43:26 PM By: Philip Amen RN Entered By: [name] on 07/21/2021 16:43:26 [name], [name] ([id number]) -------------------------------------------------------------------------------- Lower Extremity Assessment Details Patient Name: [name], [name] Date of Service: 07/21/2021 8:15 AM Medical Record Number: [id number] Patient Account Number: [id number] Date of Birth/Sex: 07-28-1961 (60 y.o. M) Treating RN: [name] Primary Care Jennfer Gassen: PATIENT, NO Other Clinician: Referring Kalyiah Saintil: [name] Treating Kharter Sestak/Extender: [name] in [name]: 1 Edema Assessment Assessed: [Left: Yes] [Right: No] Edema: [Left: Ye] [Right: s] Calf Left: Right: Point of Measurement: 40 cm From Medial Instep 43.5 cm Ankle Left: Right: Point of Measurement: 12 cm From Medial Instep 25.5 cm Knee To Floor Left: Right: From Medial Instep 52 cm Vascular Assessment Pulses: Dorsalis Pedis Palpable: [Left:Yes] Electronic Signature(s) Signed: 07/21/2021 4:52:51 PM By: Philip Amen RN Entered By: [name] on 07/21/2021 08:34:58 [name], [name]  ([id number]) -------------------------------------------------------------------------------- Multi Wound Chart Details Patient Name: [name], [name] Date of Service: 07/21/2021 8:15 AM Medical Record Number: [id number] Patient Account Number: [id number] Date of Birth/Sex: 12-22-1960 (60 y.o. M) Treating RN: [name] Primary Care Webb Weed: PATIENT, NO Other Clinician: Referring Joselito Fieldhouse: [name] Treating Kishana Battey/Extender: [name] in [name]: 1 Vital Signs Height(in): 74 Pulse(bpm): 55 Weight(lbs): 294 Blood Pressure(mmHg): 151/80 Body Mass Index(BMI): 38 Temperature(F): 98.3 Respiratory Rate(breaths/min): 18 Photos: [N/A:N/A] Wound Location: Left, Lateral Lower Leg N/A N/A Wounding Event: Gradually Appeared N/A N/A Primary Etiology: Venous Leg Ulcer N/A N/A Comorbid History: Sleep Apnea, Hypertension N/A N/A Date Acquired: 05/09/2021 N/A N/A Weeks of [name]: 1 N/A N/A Wound Status: Open N/A N/A Measurements L x W x D (cm) 2x2x0.2 N/A N/A Area (cm) : 3.142 N/A N/A Volume (cm) : 0.628 N/A N/A % Reduction in Area: 48.00% N/A N/A % Reduction in Volume: 48.10% N/A N/A Classification: Full Thickness Without Exposed N/A N/A Support Structures Exudate Amount: Medium N/A N/A Exudate Type: Serosanguineous N/A N/A Exudate Color: red, brown N/A N/A Granulation Amount: Large (67-100%) N/A N/A Granulation Quality: Red, Pink N/A N/A Necrotic Amount: Small (1-33%) N/A N/A Exposed Structures: Fat Layer (Subcutaneous Tissue): N/A N/A Yes Fascia: No Tendon: No Muscle: No Joint: No Bone: No Epithelialization: None N/A N/A [name] Notes Electronic Signature(s) Signed: 07/21/2021 4:52:51 PM By: Philip Amen RN Entered By: [name] on 07/21/2021 08:47:35 Shawley, [name] ([id number]) -------------------------------------------------------------------------------- [place] Details Patient Name: [name], Philip  L. Date of Service: 07/21/2021 8:15 AM Medical Record Number: [id number] Patient Account Number: [id number] Date of Birth/Sex: Jan 10, 1961 (60 y.o. M) Treating RN: [name] Primary Care Ulrich Soules: PATIENT, NO Other Clinician: Referring Ailyn Gladd: [name] Treating Jacobi Ryant/Extender: [name] in [name]: 1 Active Inactive Wound/Skin Impairment Nursing Diagnoses: Impaired tissue integrity Goals: Patient/caregiver will verbalize understanding of skin care regimen Date Initiated: 07/10/2021 Target Resolution Date: 07/10/2021 Goal Status: Active Ulcer/skin breakdown will have a volume reduction of 30% by week 4 Date Initiated: 07/10/2021 Target Resolution Date: 08/09/2021 Goal Status: Active Ulcer/skin breakdown will have a volume reduction of 50% by week 8 Date Initiated: 07/10/2021 Target Resolution Date: 09/09/2021 Goal Status: Active Ulcer/skin breakdown will have a volume reduction of 80% by week 12 Date Initiated: 07/10/2021 Target Resolution Date: 10/09/2021  Goal Status: Active Ulcer/skin breakdown will heal within 14 weeks Date Initiated: 07/10/2021 Target Resolution Date: 11/09/2021 Goal Status: Active Interventions: Assess patient/caregiver ability to obtain necessary supplies Assess patient/caregiver ability to perform ulcer/skin care regimen upon admission and as needed Assess ulceration(s) every visit Provide education on smoking Provide education on ulcer and skin care [name] Activities: Skin care regimen initiated : 07/10/2021 Notes: Electronic Signature(s) Signed: 07/21/2021 4:52:51 PM By: Philip Amen RN Entered By: [name] on 07/21/2021 08:47:09 [name], [name] ([id number]) -------------------------------------------------------------------------------- Pain Assessment Details Patient Name: [name], Jaun L. Date of Service: 07/21/2021 8:15 AM Medical Record Number: [id number] Patient Account Number: [id number] Date of Birth/Sex:  1961/07/02 (60 y.o. M) Treating RN: [name] Primary Care Jamiaya Bina: PATIENT, NO Other Clinician: Referring Audriella Blakeley: [name] Treating Vivan Agostino/Extender: [name] in [name]: 1 Active Problems Location of Pain Severity and Description of Pain Patient Has Paino No Site Locations Rate the pain. Current Pain Level: 0 Pain Management and Medication Current Pain Management: Electronic Signature(s) Signed: 07/21/2021 4:52:51 PM By: Philip Amen RN Entered By: [name] on 07/21/2021 08:23:15 [name] ([id number]) -------------------------------------------------------------------------------- Patient/Caregiver Education Details Patient Name: [name], [name] Date of Service: 07/21/2021 8:15 AM Medical Record Number: [id number] Patient Account Number: [id number] Date of Birth/Gender: [birth year] (60 y.o. M) Treating RN: [name] Primary Care Physician: PATIENT, NO Other Clinician: Referring Physician: Rosalin [name] Treating Physician/Extender: [name] in [name]: 1 Education Assessment Education Provided To: Patient Education Topics Provided Wound/Skin Impairment: Methods: Explain/Verbal Responses: State content correctly Electronic Signature(s) Signed: 07/21/2021 4:52:51 PM By: Philip Amen RN Entered By: [name] on 07/21/2021 09:07:39 [name] ([id number]) -------------------------------------------------------------------------------- Wound Assessment Details Patient Name: [name], [name] Date of Service: 07/21/2021 8:15 AM Medical Record Number: [id number] Patient Account Number: [id number] Date of Birth/Sex: 1961-06-11 (60 y.o. M) Treating RN: [name] Primary Care Tyshawn Keel: PATIENT, NO Other Clinician: Referring [place] Ament: [name] Treating Quoc Tome/Extender: [name] in [name]: 1 Wound Status Wound Number: 1 Primary Etiology: Venous Leg Ulcer Wound  Location: Left, Lateral Lower Leg Wound Status: Open Wounding Event: Gradually Appeared Comorbid History: Sleep Apnea, Hypertension Date Acquired: 05/09/2021 Weeks Of [name]: 1 Clustered Wound: No Photos Wound Measurements Length: (cm) 2 Width: (cm) 2 Depth: (cm) 0.2 Area: (cm) 3.142 Volume: (cm) 0.628 % Reduction in Area: 48% % Reduction in Volume: 48.1% Epithelialization: None Tunneling: No Undermining: No Wound Description Classification: Full Thickness Without Exposed Support Structures Exudate Amount: Medium Exudate Type: Serosanguineous Exudate Color: red, brown Foul Odor After Cleansing: No Slough/Fibrino Yes Wound Bed Granulation Amount: Large (67-100%) Exposed Structure Granulation Quality: Red, Pink Fascia Exposed: No Necrotic Amount: Small (1-33%) Fat Layer (Subcutaneous Tissue) Exposed: Yes Necrotic Quality: Adherent Slough Tendon Exposed: No Muscle Exposed: No Joint Exposed: No Bone Exposed: No [name] Notes Wound #1 (Lower Leg) Wound Laterality: Left, Lateral Cleanser Soap and Water Discharge Instruction: Gently cleanse wound with antibacterial soap, rinse and pat dry prior to dressing wounds Peri-Wound Care [name], [name] ([id number]) Topical Primary Dressing Hydrofera Blue Ready Transfer Foam, 2.5x2.5 (in/in) Discharge Instruction: Apply Hydrofera Blue Ready to wound bed as directed Secondary Dressing [place], 4x4 (in/in) Discharge Instruction: Apply over dressing to secure in place. Secured With Compression Wrap Compression Stockings Environmental education officer) Signed: 07/21/2021 4:52:51 PM By: Philip Amen RN Entered By: [name] on 07/21/2021 08:33:02 [name] ([id number]) -------------------------------------------------------------------------------- Vitals Details Patient Name: [name], Sebert L. Date of Service: 07/21/2021 8:15 AM Medical  Record Number: [id number] Patient  Account Number: [id number] Date of Birth/Sex: 05/19/1961 (60 y.o. M) Treating RN: [name] Primary Care Jannifer Fischler: PATIENT, NO Other Clinician: Referring Avnoor Koury: [name] Treating Kenrick Pore/Extender: [name] in [name]: 1 Vital Signs Time Taken: 08:21 Temperature (F): 98.3 Height (in): 74 Pulse (bpm): 55 Weight (lbs): 294 Respiratory Rate (breaths/min): 18 Body Mass Index (BMI): 37.7 Blood Pressure (mmHg): 151/80 Reference Range: 80 - 120 mg / dl Electronic Signature(s) Signed: 07/21/2021 4:52:51 PM By: Philip Amen RN Entered By: [name] on 07/21/2021 08:22:53

## 2021-08-04 ENCOUNTER — Encounter: Payer: BC Managed Care – PPO | Attending: Physician Assistant | Admitting: Physician Assistant

## 2021-08-04 ENCOUNTER — Other Ambulatory Visit: Payer: Self-pay

## 2021-08-04 DIAGNOSIS — L97822 Non-pressure chronic ulcer of other part of left lower leg with fat layer exposed: Secondary | ICD-10-CM | POA: Diagnosis not present

## 2021-08-04 DIAGNOSIS — I10 Essential (primary) hypertension: Secondary | ICD-10-CM | POA: Diagnosis not present

## 2021-08-04 DIAGNOSIS — I872 Venous insufficiency (chronic) (peripheral): Secondary | ICD-10-CM | POA: Diagnosis not present

## 2021-08-04 NOTE — Progress Notes (Addendum)
GUY, SEESE ([id number]) Visit Report for 08/04/2021 Chief Complaint Document Details Patient Name: [name], [name]. Date of Service: 08/04/2021 10:45 AM Medical Record Number: [id number] Patient Account Number: [id number] Date of Birth/Sex: 1961/09/21 (60 y.o. M) Treating RN: Dolan Amen Primary Care Provider: PATIENT, NO Other Clinician: Referring Provider: Rosalin Hawking Treating Provider/Extender: Skipper Cliche in Treatment: 3 Information Obtained from: Patient Chief Complaint Left LE Ulcer Electronic Signature(s) Signed: 08/04/2021 11:27:27 AM By: Worthy Keeler PA-C Entered By: Worthy Keeler on 08/04/2021 11:27:27 [name] ([id number]) -------------------------------------------------------------------------------- Debridement Details Patient Name: [name], [name] Date of Service: 08/04/2021 10:45 AM Medical Record Number: [id number] Patient Account Number: [id number] Date of Birth/Sex: 1961/07/18 (61 y.o. M) Treating RN: Dolan Amen Primary Care Provider: PATIENT, NO Other Clinician: Referring Provider: Rosalin Hawking Treating Provider/Extender: Skipper Cliche in Treatment: 3 Debridement Performed for Wound #1 Left,Lateral Lower Leg Assessment: Performed By: Physician Tommie Sams., PA-C Debridement Type: Debridement Severity of Tissue Pre Debridement: Fat layer exposed Level of Consciousness (Pre- Awake and Alert procedure): Pre-procedure Verification/Time Out Yes - 11:46 Taken: Start Time: 11:46 Total Area Debrided (L x W): 2 (cm) x 0.5 (cm) = 1 (cm) Tissue and other material Viable, Non-Viable, Slough, Subcutaneous, Biofilm, Slough debrided: Level: Skin/Subcutaneous Tissue Debridement Description: Excisional Instrument: Curette Bleeding: Minimum Hemostasis Achieved: Pressure Response to Treatment: Procedure was tolerated well Level of Consciousness (Post- Awake and Alert procedure): Post Debridement  Measurements of Total Wound Length: (cm) 3 Width: (cm) 1.8 Depth: (cm) 0.2 Volume: (cm) 0.848 Character of Wound/Ulcer Post Debridement: Stable Severity of Tissue Post Debridement: Fat layer exposed Post Procedure Diagnosis Same as Pre-procedure Electronic Signature(s) Signed: 08/04/2021 4:40:19 PM By: Dolan Amen RN Signed: 08/04/2021 5:28:09 PM By: Worthy Keeler PA-C Entered By: Dolan Amen on 08/04/2021 11:46:49 [place], Anon Raices ([id number]) -------------------------------------------------------------------------------- HPI Details Patient Name: [name], [name] Date of Service: 08/04/2021 10:45 AM Medical Record Number: [id number] Patient Account Number: [id number] Date of Birth/Sex: 1961/08/09 (60 y.o. M) Treating RN: Dolan Amen Primary Care Provider: PATIENT, NO Other Clinician: Referring Provider: Rosalin Hawking Treating Provider/Extender: Skipper Cliche in Treatment: 3 History of Present Illness HPI Description: 07/10/2021 upon evaluation today patient appears to be doing somewhat poorly in regard to the wound on his left lateral lower extremity. This is a venous leg ulcer. 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 Fortunately there does not appear to be any signs of infection based on what I am seeing right now but he does have some issues noted here with some pain he has not been covering this occasionally he will put a Band-Aid and some Neosporin on but that is about it. He does have a history of hypertension and chronic venous insufficiency no other major medical problems. 07/21/2021 upon evaluation today patient appears to be doing decently well in regard to his leg ulcer. There is a lot of dry skin around the edges of the wound I am can have to perform sharp debridement to clear this away today. Overall however otherwise I think that he is actually  making good progress here and I am very pleased with where things stand. No fevers, chills, nausea, vomiting, or diarrhea. 08/04/2021 upon evaluation today patient's wound actually showing some signs of good improvement which is great news and I am very pleased in that regard. Fortunately there does not appear to be any evidence of active infection  which is great news and overall I am extremely pleased with where things stand today. No fevers, chills, nausea, vomiting, or diarrhea. Electronic Signature(s) Signed: 08/04/2021 1:26:25 PM By: Worthy Keeler PA-C Entered By: Worthy Keeler on 08/04/2021 13:26:25 [name] ([id number]) -------------------------------------------------------------------------------- Physical Exam Details Patient Name: [name], [name] Date of Service: 08/04/2021 10:45 AM Medical Record Number: [id number] Patient Account Number: [id number] Date of Birth/Sex: 1961-05-11 (60 y.o. M) Treating RN: Dolan Amen Primary Care Provider: PATIENT, NO Other Clinician: Referring Provider: Rosalin Hawking Treating Provider/Extender: Skipper Cliche in Treatment: 3 Constitutional Well-nourished and well-hydrated in no acute distress. Respiratory normal breathing without difficulty. Psychiatric this patient is able to make decisions and demonstrates good insight into disease process. Alert and Oriented x 3. pleasant and cooperative. Notes Upon inspection patient's wound bed did require sharp debridement clear away some of the necrotic debris he tolerated that today without complication postdebridement the wound bed appears to be doing much better which is great news. Electronic Signature(s) Signed: 08/04/2021 1:27:07 PM By: Worthy Keeler PA-C Entered By: Worthy Keeler on 08/04/2021 13:27:06 [name] ([id number]) -------------------------------------------------------------------------------- Physician Orders Details Patient Name: Philip Rower L. Date of Service: 08/04/2021 10:45 AM Medical Record Number: [id number] Patient Account Number: [id number] Date of Birth/Sex: [birth year] (60 y.o. M) Treating RN: Dolan Amen Primary Care Provider: PATIENT, NO Other Clinician: Referring Provider: Rosalin Hawking Treating Provider/Extender: Skipper Cliche in Treatment: 3 Verbal / Phone Orders: No Diagnosis Coding ICD-10 Coding Code Description I87.2 Venous insufficiency (chronic) (peripheral) L97.822 Non-pressure chronic ulcer of other part of left lower leg with fat layer exposed [place] (primary) hypertension Follow-up Appointments o Return Appointment in 2 weeks. Bathing/ Shower/ Hygiene o May shower; gently cleanse wound with antibacterial soap, rinse and pat dry prior to dressing wounds Edema Control - Lymphedema / Segmental Compressive Device / Other o Patient to wear own compression stockings. Remove compression stockings every night before going to bed and put on every morning when getting up. o Elevate, Exercise Daily and Avoid Standing for Long Periods of Time. o Elevate legs to the level of the heart and pump ankles as often as possible o Elevate leg(s) parallel to the floor when sitting. Wound Treatment Wound #1 - Lower Leg Wound Laterality: Left, Lateral Cleanser: Soap and Water 3 x Per Week/30 Days Discharge Instructions: Gently cleanse wound with antibacterial soap, rinse and pat dry prior to dressing wounds Primary Dressing: Hydrofera Blue Ready Transfer Foam, 2.5x2.5 (in/in) (Generic) 3 x Per Week/30 Days Discharge Instructions: Apply Hydrofera Blue Ready to wound bed as directed Secondary Dressing: [place] Dressing, 4x4 (in/in) (Generic) 3 x Per Week/30 Days Discharge Instructions: Apply over dressing to secure in place. Electronic Signature(s) Signed: 08/04/2021 4:40:19 PM By: Dolan Amen RN Signed: 08/04/2021 5:28:09 PM By: Worthy Keeler PA-C Entered By:  Dolan Amen on 08/04/2021 11:53:47 [name], Philip Carlean [name] ([id number]) -------------------------------------------------------------------------------- Problem List Details Patient Name: [name], [name] Date of Service: 08/04/2021 10:45 AM Medical Record Number: [id number] Patient Account Number: [id number] Date of Birth/Sex: [birth year] (60 y.o. M) Treating RN: Dolan Amen Primary Care Provider: PATIENT, NO Other Clinician: Referring Provider: Rosalin Hawking Treating Provider/Extender: Skipper Cliche in Treatment: 3 Active Problems ICD-10 Encounter Code Description Active Date MDM Diagnosis I87.2 Venous insufficiency (chronic) (peripheral) 07/10/2021 No Yes L97.822 Non-pressure chronic ulcer of other part of left lower leg with fat layer 07/10/2021 No Yes exposed [place] (primary) hypertension 07/10/2021 No Yes Inactive Problems Resolved Problems Electronic Signature(s)  Signed: 08/04/2021 11:27:21 AM By: Worthy Keeler PA-C Entered By: Worthy Keeler on 08/04/2021 11:27:21 [name] ([id number]) -------------------------------------------------------------------------------- Progress Note Details Patient Name: [name], [name] Date of Service: 08/04/2021 10:45 AM Medical Record Number: [id number] Patient Account Number: [id number] Date of Birth/Sex: 1961-08-10 (60 y.o. M) Treating RN: Dolan Amen Primary Care Provider: PATIENT, NO Other Clinician: Referring Provider: Rosalin Hawking Treating Provider/Extender: Skipper Cliche in Treatment: 3 Subjective Chief Complaint Information obtained from Patient Left LE Ulcer History of Present Illness (HPI) 07/10/2021 upon evaluation today patient appears to be doing somewhat poorly in regard to the wound on his left lateral lower extremity. This is a venous leg ulcer. 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  Fortunately there does not appear to be any signs of infection based on what I am seeing right now but he does have some issues noted here with some pain he has not been covering this occasionally he will put a Band-Aid and some Neosporin on but that is about it. He does have a history of hypertension and chronic venous insufficiency no other major medical problems. 07/21/2021 upon evaluation today patient appears to be doing decently well in regard to his leg ulcer. There is a lot of dry skin around the edges of the wound I am can have to perform sharp debridement to clear this away today. Overall however otherwise I think that he is actually making good progress here and I am very pleased with where things stand. No fevers, chills, nausea, vomiting, or diarrhea. 08/04/2021 upon evaluation today patient's wound actually showing some signs of good improvement which is great news and I am very pleased in that regard. Fortunately there does not appear to be any evidence of active infection which is great news and overall I am extremely pleased with where things stand today. No fevers, chills, nausea, vomiting, or diarrhea. Objective Constitutional Well-nourished and well-hydrated in no acute distress. Vitals Time Taken: 11:21 AM, Height: 74 in, Weight: 294 lbs, BMI: 37.7, Temperature: 97.7 F, Pulse: 65 bpm, Respiratory Rate: 18 breaths/min, Blood Pressure: 136/85 mmHg. Respiratory normal breathing without difficulty. Psychiatric this patient is able to make decisions and demonstrates good insight into disease process. Alert and Oriented x 3. pleasant and cooperative. General Notes: Upon inspection patient's wound bed did require sharp debridement clear away some of the necrotic debris he tolerated that today without complication postdebridement the wound bed appears to be doing much better which is great news. Integumentary (Hair, Skin) Wound #1 status is Open. Original cause of wound was  Gradually Appeared. The date acquired was: 05/09/2021. The wound has been in treatment 3 weeks. The wound is located on the Left,Lateral Lower Leg. The wound measures 3cm length x 1.8cm width x 0.1cm depth; 4.241cm^2 area and 0.424cm^3 volume. There is Fat Layer (Subcutaneous Tissue) exposed. There is no tunneling or undermining noted. There is a medium amount of serosanguineous drainage noted. There is large (67-100%) red, pink granulation within the wound bed. There is no necrotic tissue within the wound bed. Assessment Active Problems ICD-10 Venous insufficiency (chronic) (peripheral) [name], [name] ([id number]) Non-pressure chronic ulcer of other part of left lower leg with fat layer exposed [place] (primary) hypertension Procedures Wound #1 Pre-procedure diagnosis of Wound #1 is a Venous Leg Ulcer located on the Left,Lateral Lower Leg .Severity of Tissue Pre Debridement is: Fat layer exposed. There was a  Excisional Skin/Subcutaneous Tissue Debridement with a total area of 1 sq cm performed by Tommie Sams., PA-C. With the following instrument(s): Curette to remove Viable and Non-Viable tissue/material. Material removed includes Subcutaneous Tissue, Slough, and Biofilm. A time out was conducted at 11:46, prior to the start of the procedure. A Minimum amount of bleeding was controlled with Pressure. The procedure was tolerated well. Post Debridement Measurements: 3cm length x 1.8cm width x 0.2cm depth; 0.848cm^3 volume. Character of Wound/Ulcer Post Debridement is stable. Severity of Tissue Post Debridement is: Fat layer exposed. Post procedure Diagnosis Wound #1: Same as Pre-Procedure Plan Follow-up Appointments: Return Appointment in 2 weeks. Bathing/ Shower/ Hygiene: May shower; gently cleanse wound with antibacterial soap, rinse and pat dry prior to dressing wounds Edema Control - Lymphedema / Segmental Compressive Device / Other: Patient to wear own compression stockings.  Remove compression stockings every night before going to bed and put on every morning when getting up. Elevate, Exercise Daily and Avoid Standing for Long Periods of Time. Elevate legs to the level of the heart and pump ankles as often as possible Elevate leg(s) parallel to the floor when sitting. WOUND #1: - Lower Leg Wound Laterality: Left, Lateral Cleanser: Soap and Water 3 x Per Week/30 Days Discharge Instructions: Gently cleanse wound with antibacterial soap, rinse and pat dry prior to dressing wounds Primary Dressing: Hydrofera Blue Ready Transfer Foam, 2.5x2.5 (in/in) (Generic) 3 x Per Week/30 Days Discharge Instructions: Apply Hydrofera Blue Ready to wound bed as directed Secondary Dressing: [place] Dressing, 4x4 (in/in) (Generic) 3 x Per Week/30 Days Discharge Instructions: Apply over dressing to secure in place. 1. I would recommend currently that we go ahead and continue with the [facility] I think that is doing a great job of the wound overall looks to be doing quite well. 2. I am also can recommend the patient continue to monitor for any signs of worsening or infection if anything changes she should let me know as soon as possible. Otherwise he has been using the compression socks which seems to be doing a good job. 3. He will also continue covering this with a [place] dressing. We will see patient back for reevaluation in 2 weeks here in the clinic. If anything worsens or changes patient will contact our office for additional recommendations. Electronic Signature(s) Signed: 08/04/2021 1:27:33 PM By: Worthy Keeler PA-C Entered By: Worthy Keeler on 08/04/2021 13:27:33 [name], [name] ([id number]) -------------------------------------------------------------------------------- SuperBill Details Patient Name: Philip Rower L. Date of Service: 08/04/2021 Medical Record Number: [id number] Patient Account Number: [id number] Date of Birth/Sex: 1961/03/27  (60 y.o. M) Treating RN: Dolan Amen Primary Care Provider: PATIENT, NO Other Clinician: Referring Provider: Rosalin Hawking Treating Provider/Extender: Skipper Cliche in Treatment: 3 Diagnosis Coding ICD-10 Codes Code Description I87.2 Venous insufficiency (chronic) (peripheral) L97.822 Non-pressure chronic ulcer of other part of left lower leg with fat layer exposed [place] (primary) hypertension Facility Procedures CPT4 Code: [id number] Description: 68341 - DEB SUBQ TISSUE 20 SQ CM/< Modifier: Quantity: 1 CPT4 Code: Description: ICD-10 Diagnosis Description L97.822 Non-pressure chronic ulcer of other part of left lower leg with fat layer Modifier: exposed Quantity: Physician Procedures CPT4 Code: 9622297 Description: 98921 - WC PHYS SUBQ TISS 20 SQ CM Modifier: Quantity: 1 CPT4 Code: Description: ICD-10 Diagnosis Description L97.822 Non-pressure chronic ulcer of other part of left lower leg with fat layer Modifier: exposed Quantity: Electronic Signature(s) Signed: 08/04/2021 1:27:51 PM By: Worthy Keeler PA-C Entered By: Worthy Keeler on  08/04/2021 13:27:51 

## 2021-08-04 NOTE — Progress Notes (Signed)
Philip, [name] ([id number]) Visit Report for 08/04/2021 Arrival Information Details Patient Name: [name], [name]. Date of Service: 08/04/2021 10:45 AM Medical Record Number: [id number] Patient Account Number: [id number] Date of Birth/Sex: Feb 14, 1961 (60 y.o. M) Treating RN: Dolan Amen Primary Care Iline Buchinger: PATIENT, NO Other Clinician: Referring Zakari Bathe: Rosalin Hawking Treating Viriginia Amendola/Extender: Skipper Cliche in Treatment: 3 Visit Information History Since Last Visit Pain Present Now: No Patient Arrived: Ambulatory Arrival Time: 11:18 Accompanied By: self Transfer Assistance: None Patient Identification Verified: Yes Secondary Verification Process Completed: Yes Patient Requires Transmission-Based Precautions: No Patient Has Alerts: No Electronic Signature(s) Signed: 08/04/2021 4:40:19 PM By: Dolan Amen RN Entered By: Dolan Amen on 08/04/2021 11:18:51 Rafferty, Avon ([id number]) -------------------------------------------------------------------------------- Clinic Level of Care Assessment Details Patient Name: [name], [name] Date of Service: 08/04/2021 10:45 AM Medical Record Number: [id number] Patient Account Number: [id number] Date of Birth/Sex: Oct 03, 1961 (60 y.o. M) Treating RN: Dolan Amen Primary Care Landan Fedie: PATIENT, NO Other Clinician: Referring Amorette Charrette: Rosalin Hawking Treating Glorian Mcdonell/Extender: Skipper Cliche in Treatment: 3 Clinic Level of Care Assessment Items TOOL 1 Quantity Score _0  - Use when EandM and Procedure is performed on INITIAL visit 0 ASSESSMENTS - Nursing Assessment / Reassessment _1  - General Physical Exam (combine w/ comprehensive assessment (listed just below) when performed on new 0 pt. evals) _2  - 0 Comprehensive Assessment (HX, ROS, Risk Assessments, Wounds Hx, etc.) ASSESSMENTS - Wound and Skin Assessment / Reassessment _3  - Dermatologic / Skin Assessment (not related to wound area)  0 ASSESSMENTS - Ostomy and/or Continence Assessment and Care _4  - Incontinence Assessment and Management 0 _5  - 0 Ostomy Care Assessment and Management (repouching, etc.) PROCESS - Coordination of Care _6  - Simple Patient / Family Education for ongoing care 0 _7  - 0 Complex (extensive) Patient / Family Education for ongoing care _8  - 0 Staff obtains Programmer, systems, Records, Test Results / Process Orders _9  - 0 Staff telephones HHA, Nursing Homes / Clarify orders / etc _10  - 0 Routine Transfer to another Facility (non-emergent condition) _11  - 0 Routine Hospital Admission (non-emergent condition) _12  - 0 New Admissions / Biomedical engineer / Ordering NPWT, Apligraf, etc. _13  - 0 Emergency Hospital Admission (emergent condition) PROCESS - Special Needs _14  - Pediatric / Minor Patient Management 0 _15  - 0 Isolation Patient Management _16  - 0 Hearing / Language / Visual special needs _17  - 0 Assessment of Community assistance (transportation, D/C planning, etc.) _18  - 0 Additional assistance / Altered mentation _19  - 0 Support Surface(s) Assessment (bed, cushion, seat, etc.) INTERVENTIONS - Miscellaneous _20  - External ear exam 0 _21  - 0 Patient Transfer (multiple staff / Civil Service fast streamer / Similar devices) _22  - 0 Simple Staple / Suture removal (25 or less) _23  - 0 Complex Staple / Suture removal (26 or more) _24  - 0 Hypo/Hyperglycemic Management (do not check if billed separately) _25  - 0 Ankle / Brachial Index (ABI) - do not check if billed separately Has the patient been seen at the hospital within the last three years: Yes Total Score: 0 Level Of Care: ____ [name] ([id number]) Electronic Signature(s) Signed: 08/04/2021 4:40:19 PM By: Dolan Amen RN Entered By: Dolan Amen on 08/04/2021 11:51:10 Goll, Kenderick L. ([id number]) -------------------------------------------------------------------------------- Lower Extremity Assessment Details Patient Name: [name],  [name] Date of Service: 08/04/2021 10:45 AM Medical Record Number: [id number] Patient Account Number: [id number] Date of Birth/Sex: 02-Dec-1960 (60 y.o. M) Treating RN: Dolan Amen Primary Care Donaven Criswell: PATIENT, NO Other Clinician: Referring Jullisa Grigoryan: Rosalin Hawking Treating Jeyda Siebel/Extender: Skipper Cliche  in Treatment: 3 Edema Assessment Assessed: [Left: Yes] [Right: No] Edema: [Left: Ye] [Right: s] Calf Left: Right: Point of Measurement: 40 cm From Medial Instep 43.5 cm Ankle Left: Right: Point of Measurement: 12 cm From Medial Instep 29 cm Vascular Assessment Pulses: Dorsalis Pedis Palpable: [Left:Yes] Electronic Signature(s) Signed: 08/04/2021 4:40:19 PM By: Dolan Amen RN Entered By: Dolan Amen on 08/04/2021 11:29:27 [name] ([id number]) -------------------------------------------------------------------------------- Multi Wound Chart Details Patient Name: [name], [name] Date of Service: 08/04/2021 10:45 AM Medical Record Number: [id number] Patient Account Number: [id number] Date of Birth/Sex: 03-14-1961 (60 y.o. M) Treating RN: Dolan Amen Primary Care Orva Riles: PATIENT, NO Other Clinician: Referring Milbert Bixler: Rosalin Hawking Treating Sagan Maselli/Extender: Skipper Cliche in Treatment: 3 Vital Signs Height(in): 74 Pulse(bpm): 65 Weight(lbs): 294 Blood Pressure(mmHg): 136/85 Body Mass Index(BMI): 38 Temperature(F): 97.7 Respiratory Rate(breaths/min): 18 Photos: [N/A:N/A] Wound Location: Left, Lateral Lower Leg N/A N/A Wounding Event: Gradually Appeared N/A N/A Primary Etiology: Venous Leg Ulcer N/A N/A Comorbid History: Sleep Apnea, Hypertension N/A N/A Date Acquired: 05/09/2021 N/A N/A Weeks of Treatment: 3 N/A N/A Wound Status: Open N/A N/A Measurements L x W x D (cm) 3x1.8x0.1 N/A N/A Area (cm) : 4.241 N/A N/A Volume (cm) : 0.424 N/A N/A % Reduction in Area: 29.90% N/A N/A % Reduction in Volume: 65.00% N/A  N/A Classification: Full Thickness Without Exposed N/A N/A Support Structures Exudate Amount: Medium N/A N/A Exudate Type: Serosanguineous N/A N/A Exudate Color: red, brown N/A N/A Granulation Amount: Large (67-100%) N/A N/A Granulation Quality: Red, Pink N/A N/A Necrotic Amount: None Present (0%) N/A N/A Exposed Structures: Fat Layer (Subcutaneous Tissue): N/A N/A Yes Fascia: No Tendon: No Muscle: No Joint: No Bone: No Epithelialization: Medium (34-66%) N/A N/A Treatment Notes Electronic Signature(s) Signed: 08/04/2021 4:40:19 PM By: Dolan Amen RN Entered By: Dolan Amen on 08/04/2021 11:45:53 [name] ([id number]) -------------------------------------------------------------------------------- Antler Details Patient Name: [name], Dreyton L. Date of Service: 08/04/2021 10:45 AM Medical Record Number: [id number] Patient Account Number: [id number] Date of Birth/Sex: 02/22/1961 (60 y.o. M) Treating RN: Dolan Amen Primary Care Brianny Soulliere: PATIENT, NO Other Clinician: Referring Dareld Mcauliffe: Rosalin Hawking Treating Nancee Brownrigg/Extender: Skipper Cliche in Treatment: 3 Active Inactive Wound/Skin Impairment Nursing Diagnoses: Impaired tissue integrity Goals: Patient/caregiver will verbalize understanding of skin care regimen Date Initiated: 07/10/2021 Date Inactivated: 08/04/2021 Target Resolution Date: 07/10/2021 Goal Status: Met Ulcer/skin breakdown will have a volume reduction of 30% by week 4 Date Initiated: 07/10/2021 Target Resolution Date: 08/09/2021 Goal Status: Active Ulcer/skin breakdown will have a volume reduction of 50% by week 8 Date Initiated: 07/10/2021 Target Resolution Date: 09/09/2021 Goal Status: Active Ulcer/skin breakdown will have a volume reduction of 80% by week 12 Date Initiated: 07/10/2021 Target Resolution Date: 10/09/2021 Goal Status: Active Ulcer/skin breakdown will heal within 14 weeks Date Initiated:  07/10/2021 Target Resolution Date: 11/09/2021 Goal Status: Active Interventions: Assess patient/caregiver ability to obtain necessary supplies Assess patient/caregiver ability to perform ulcer/skin care regimen upon admission and as needed Assess ulceration(s) every visit Provide education on smoking Provide education on ulcer and skin care Treatment Activities: Skin care regimen initiated : 07/10/2021 Notes: Electronic Signature(s) Signed: 08/04/2021 4:40:19 PM By: Dolan Amen RN Entered By: Dolan Amen on 08/04/2021 11:44:48 Ebbert, Bracken L. ([id number]) -------------------------------------------------------------------------------- Pain Assessment Details Patient Name: [name], Asier L. Date of Service: 08/04/2021 10:45 AM Medical Record Number: [id number] Patient Account Number: [id number] Date of Birth/Sex: [birth year] (60 y.o. M) Treating RN: Dolan Amen Primary Care Duanna Runk: PATIENT, NO Other Clinician: Referring Berdene Askari: Rosalin Hawking Treating Alfretta Pinch/Extender: Joaquim Lai,  Hoyt Weeks in Treatment: 3 Active Problems Location of Pain Severity and Description of Pain Patient Has Paino No Site Locations Rate the pain. Current Pain Level: 0 Pain Management and Medication Current Pain Management: Electronic Signature(s) Signed: 08/04/2021 4:40:19 PM By: Dolan Amen RN Entered By: Dolan Amen on 08/04/2021 11:22:49 [name] ([id number]) -------------------------------------------------------------------------------- Patient/Caregiver Education Details Patient Name: [name], Asencion L. Date of Service: 08/04/2021 10:45 AM Medical Record Number: [id number] Patient Account Number: [id number] Date of Birth/Gender: [birth year] (60 y.o. M) Treating RN: Dolan Amen Primary Care Physician: PATIENT, NO Other Clinician: Referring Physician: Rosalin Hawking Treating Physician/Extender: Skipper Cliche in Treatment: 3 Education Assessment Education  Provided To: Patient Education Topics Provided Wound/Skin Impairment: Methods: Explain/Verbal Responses: State content correctly Electronic Signature(s) Signed: 08/04/2021 4:40:19 PM By: Dolan Amen RN Entered By: Dolan Amen on 08/04/2021 11:53:57 Mccosh, Mansoor L. ([id number]) -------------------------------------------------------------------------------- Wound Assessment Details Patient Name: SAY, Eliel L. Date of Service: 08/04/2021 10:45 AM Medical Record Number: [id number] Patient Account Number: [id number] Date of Birth/Sex: 04/08/1961 (60 y.o. M) Treating RN: Dolan Amen Primary Care Florine Sprenkle: PATIENT, NO Other Clinician: Referring Trinetta Alemu: Rosalin Hawking Treating Macaria Bias/Extender: Skipper Cliche in Treatment: 3 Wound Status Wound Number: 1 Primary Etiology: Venous Leg Ulcer Wound Location: Left, Lateral Lower Leg Wound Status: Open Wounding Event: Gradually Appeared Comorbid History: Sleep Apnea, Hypertension Date Acquired: 05/09/2021 Weeks Of Treatment: 3 Clustered Wound: No Photos Wound Measurements Length: (cm) 3 Width: (cm) 1.8 Depth: (cm) 0.1 Area: (cm) 4.241 Volume: (cm) 0.424 % Reduction in Area: 29.9% % Reduction in Volume: 65% Epithelialization: Medium (34-66%) Tunneling: No Undermining: No Wound Description Classification: Full Thickness Without Exposed Support Structu Exudate Amount: Medium Exudate Type: Serosanguineous Exudate Color: red, brown res Foul Odor After Cleansing: No Slough/Fibrino Yes Wound Bed Granulation Amount: Large (67-100%) Exposed Structure Granulation Quality: Red, Pink Fascia Exposed: No Necrotic Amount: None Present (0%) Fat Layer (Subcutaneous Tissue) Exposed: Yes Tendon Exposed: No Muscle Exposed: No Joint Exposed: No Bone Exposed: No Electronic Signature(s) Signed: 08/04/2021 4:40:19 PM By: Dolan Amen RN Entered By: Dolan Amen on 08/04/2021 11:26:12 [name]  ([id number]) -------------------------------------------------------------------------------- Vitals Details Patient Name: [name], Harvie L. Date of Service: 08/04/2021 10:45 AM Medical Record Number: [id number] Patient Account Number: [id number] Date of Birth/Sex: Sep 29, 1961 (60 y.o. M) Treating RN: Dolan Amen Primary Care Leiani Enright: PATIENT, NO Other Clinician: Referring Gael Londo: Rosalin Hawking Treating Lyrika Souders/Extender: Skipper Cliche in Treatment: 3 Vital Signs Time Taken: 11:21 Temperature (F): 97.7 Height (in): 74 Pulse (bpm): 65 Weight (lbs): 294 Respiratory Rate (breaths/min): 18 Body Mass Index (BMI): 37.7 Blood Pressure (mmHg): 136/85 Reference Range: 80 - 120 mg / dl Electronic Signature(s) Signed: 08/04/2021 4:40:19 PM By: Dolan Amen RN Entered By: Dolan Amen on 08/04/2021 11:22:33

## 2021-08-18 ENCOUNTER — Other Ambulatory Visit: Payer: Self-pay

## 2021-08-18 ENCOUNTER — Encounter: Payer: BC Managed Care – PPO | Admitting: Physician Assistant

## 2021-08-18 DIAGNOSIS — I872 Venous insufficiency (chronic) (peripheral): Secondary | ICD-10-CM | POA: Diagnosis not present

## 2021-08-18 NOTE — Progress Notes (Signed)
NATAN, HARTOG ([id number]) Visit Report for 08/18/2021 Chief Complaint Document Details Patient Name: [name], [name]. Date of Service: 08/18/2021 10:45 AM Medical Record Number: [id number] Patient Account Number: [id number] Date of Birth/Sex: 1960/11/04 (60 y.o. M) Treating RN: Dolan Amen Primary Care Provider: PATIENT, NO Other Clinician: Referring Provider: Rosalin Hawking Treating Provider/Extender: Skipper Cliche in Treatment: 5 Information Obtained from: Patient Chief Complaint Left LE Ulcer Electronic Signature(s) Signed: 08/18/2021 11:11:36 AM By: Worthy Keeler PA-C Entered By: Worthy Keeler on 08/18/2021 11:11:35 Delmar, Rogers Carlean Jews ([id number]) -------------------------------------------------------------------------------- HPI Details Patient Name: [name], Maria L. Date of Service: 08/18/2021 10:45 AM Medical Record Number: [id number] Patient Account Number: [id number] Date of Birth/Sex: 1960-12-08 (60 y.o. M) Treating RN: Dolan Amen Primary Care Provider: PATIENT, NO Other Clinician: Referring Provider: Rosalin Hawking Treating Provider/Extender: Skipper Cliche in Treatment: 5 History of Present Illness HPI Description: 07/10/2021 upon evaluation today patient appears to be doing somewhat poorly in regard to the wound on his left lateral lower extremity. This is a venous leg ulcer. 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 Fortunately there does not appear to be any signs of infection based on what I am seeing right now but he does have some issues noted here with some pain he has not been covering this occasionally he will put a Band-Aid and some Neosporin on but that is about it. He does have a history of hypertension and chronic venous insufficiency no other major medical problems. 07/21/2021 upon evaluation today patient appears to be doing  decently well in regard to his leg ulcer. There is a lot of dry skin around the edges of the wound I am can have to perform sharp debridement to clear this away today. Overall however otherwise I think that he is actually making good progress here and I am very pleased with where things stand. No fevers, chills, nausea, vomiting, or diarrhea. 08/04/2021 upon evaluation today patient's wound actually showing some signs of good improvement which is great news and I am very pleased in that regard. Fortunately there does not appear to be any evidence of active infection which is great news and overall I am extremely pleased with where things stand today. No fevers, chills, nausea, vomiting, or diarrhea. 08/18/2021 upon evaluation today patient appears to be doing well with regard to his wound. I am definitely seeing signs of good improvement which is great news. He has been using the compression sock that seems to be doing a great job for him. Fortunately there does not appear to be any evidence of active infection which is also excellent. Electronic Signature(s) Signed: 08/18/2021 3:10:45 PM By: Worthy Keeler PA-C Entered By: Worthy Keeler on 08/18/2021 15:10:45 [name] ([id number]) -------------------------------------------------------------------------------- Physical Exam Details Patient Name: EKHOLM, Creig L. Date of Service: 08/18/2021 10:45 AM Medical Record Number: [id number] Patient Account Number: [id number] Date of Birth/Sex: 15-Apr-1961 (60 y.o. M) Treating RN: Dolan Amen Primary Care Provider: PATIENT, NO Other Clinician: Referring Provider: Rosalin Hawking Treating Provider/Extender: Skipper Cliche in Treatment: 5 Constitutional Well-nourished and well-hydrated in no acute distress. Respiratory normal breathing without difficulty. Psychiatric this patient is able to make decisions and demonstrates good insight into disease process. Alert and Oriented x  3. pleasant and cooperative. Notes Upon inspection patient's wound bed showed signs of good granulation epithelization I do feel like the Hydrofera Blue has done well for him  up to this point I am extremely pleased with where we stand there. Fortunately there does not appear to be any signs of active infection systemically which is also great news. Electronic Signature(s) Signed: 08/18/2021 3:11:02 PM By: Worthy Keeler PA-C Entered By: Worthy Keeler on 08/18/2021 15:11:01 [name] ([id number]) -------------------------------------------------------------------------------- Physician Orders Details Patient Name: [name] Date of Service: 08/18/2021 10:45 AM Medical Record Number: [id number] Patient Account Number: [id number] Date of Birth/Sex: July 31, 1961 (60 y.o. M) Treating RN: Dolan Amen Primary Care Provider: PATIENT, NO Other Clinician: Referring Provider: Rosalin Hawking Treating Provider/Extender: Skipper Cliche in Treatment: 5 Verbal / Phone Orders: No Diagnosis Coding Follow-up Appointments o Return Appointment in 2 weeks. Bathing/ Shower/ Hygiene o May shower; gently cleanse wound with antibacterial soap, rinse and pat dry prior to dressing wounds Edema Control - Lymphedema / Segmental Compressive Device / Other o Patient to wear own compression stockings. Remove compression stockings every night before going to bed and put on every morning when getting up. o Elevate, Exercise Daily and Avoid Standing for Long Periods of Time. o Elevate legs to the level of the heart and pump ankles as often as possible o Elevate leg(s) parallel to the floor when sitting. Wound Treatment Wound #1 - Lower Leg Wound Laterality: Left, Lateral Cleanser: Soap and Water 3 x Per Week/30 Days Discharge Instructions: Gently cleanse wound with antibacterial soap, rinse and pat dry prior to dressing wounds Primary Dressing: Hydrofera Blue Ready Transfer  Foam, 2.5x2.5 (in/in) (DME) (Generic) 3 x Per Week/30 Days Discharge Instructions: Apply Hydrofera Blue Ready to wound bed as directed Secondary Dressing: [place] Dressing, 4x4 (in/in) (DME) (Generic) 3 x Per Week/30 Days Discharge Instructions: Apply over dressing to secure in place. Electronic Signature(s) Signed: 08/18/2021 12:01:13 PM By: Dolan Amen RN Signed: 08/18/2021 5:23:05 PM By: Worthy Keeler PA-C Entered By: Dolan Amen on 08/18/2021 11:13:57 [place], Blandon Carlean Jews ([id number]) -------------------------------------------------------------------------------- Problem List Details Patient Name: BLAUSTEIN, Henrick L. Date of Service: 08/18/2021 10:45 AM Medical Record Number: [id number] Patient Account Number: [id number] Date of Birth/Sex: [birth year] (60 y.o. M) Treating RN: Dolan Amen Primary Care Provider: PATIENT, NO Other Clinician: Referring Provider: Rosalin Hawking Treating Provider/Extender: Skipper Cliche in Treatment: 5 Active Problems ICD-10 Encounter Code Description Active Date MDM Diagnosis I87.2 Venous insufficiency (chronic) (peripheral) 07/10/2021 No Yes L97.822 Non-pressure chronic ulcer of other part of left lower leg with fat layer 07/10/2021 No Yes exposed [place] (primary) hypertension 07/10/2021 No Yes Inactive Problems Resolved Problems Electronic Signature(s) Signed: 08/18/2021 11:11:24 AM By: Worthy Keeler PA-C Entered By: Worthy Keeler on 08/18/2021 11:11:24 Robenson, Choua L. ([id number]) -------------------------------------------------------------------------------- Progress Note Details Patient Name: [name], Leshawn L. Date of Service: 08/18/2021 10:45 AM Medical Record Number: [id number] Patient Account Number: [id number] Date of Birth/Sex: 09-14-1961 (60 y.o. M) Treating RN: Dolan Amen Primary Care Provider: PATIENT, NO Other Clinician: Referring Provider: Rosalin Hawking Treating  Provider/Extender: Skipper Cliche in Treatment: 5 Subjective Chief Complaint Information obtained from Patient Left LE Ulcer History of Present Illness (HPI) 07/10/2021 upon evaluation today patient appears to be doing somewhat poorly in regard to the wound on his left lateral lower extremity. This is a venous leg ulcer. 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 Fortunately there does not appear to be any signs of infection based on what I am seeing right now but he does have some  issues noted here with some pain he has not been covering this occasionally he will put a Band-Aid and some Neosporin on but that is about it. He does have a history of hypertension and chronic venous insufficiency no other major medical problems. 07/21/2021 upon evaluation today patient appears to be doing decently well in regard to his leg ulcer. There is a lot of dry skin around the edges of the wound I am can have to perform sharp debridement to clear this away today. Overall however otherwise I think that he is actually making good progress here and I am very pleased with where things stand. No fevers, chills, nausea, vomiting, or diarrhea. 08/04/2021 upon evaluation today patient's wound actually showing some signs of good improvement which is great news and I am very pleased in that regard. Fortunately there does not appear to be any evidence of active infection which is great news and overall I am extremely pleased with where things stand today. No fevers, chills, nausea, vomiting, or diarrhea. 08/18/2021 upon evaluation today patient appears to be doing well with regard to his wound. I am definitely seeing signs of good improvement which is great news. He has been using the compression sock that seems to be doing a great job for him. Fortunately there does not appear to be any evidence of active infection which is also  excellent. Objective Constitutional Well-nourished and well-hydrated in no acute distress. Vitals Time Taken: 10:52 AM, Height: 74 in, Weight: 294 lbs, BMI: 37.7, Temperature: 98.6 F, Pulse: 56 bpm, Respiratory Rate: 18 breaths/min, Blood Pressure: 131/82 mmHg. Respiratory normal breathing without difficulty. Psychiatric this patient is able to make decisions and demonstrates good insight into disease process. Alert and Oriented x 3. pleasant and cooperative. General Notes: Upon inspection patient's wound bed showed signs of good granulation epithelization I do feel like the Hydrofera Blue has done well for him up to this point I am extremely pleased with where we stand there. Fortunately there does not appear to be any signs of active infection systemically which is also great news. Integumentary (Hair, Skin) Wound #1 status is Open. Original cause of wound was Gradually Appeared. The date acquired was: 05/09/2021. The wound has been in treatment 5 weeks. The wound is located on the Left,Lateral Lower Leg. The wound measures 3cm length x 1.5cm width x 0.1cm depth; 3.534cm^2 area and 0.353cm^3 volume. There is Fat Layer (Subcutaneous Tissue) exposed. There is no tunneling or undermining noted. There is a medium amount of serosanguineous drainage noted. There is large (67-100%) red granulation within the wound bed. There is no necrotic tissue within the wound bed. ZAKHI, DUPRE ([id number]) Assessment Active Problems ICD-10 Venous insufficiency (chronic) (peripheral) Non-pressure chronic ulcer of other part of left lower leg with fat layer exposed Essential (primary) hypertension Plan Follow-up Appointments: Return Appointment in 2 weeks. Bathing/ Shower/ Hygiene: May shower; gently cleanse wound with antibacterial soap, rinse and pat dry prior to dressing wounds Edema Control - Lymphedema / Segmental Compressive Device / Other: Patient to wear own compression stockings. Remove  compression stockings every night before going to bed and put on every morning when getting up. Elevate, Exercise Daily and Avoid Standing for Long Periods of Time. Elevate legs to the level of the heart and pump ankles as often as possible Elevate leg(s) parallel to the floor when sitting. WOUND #1: - Lower Leg Wound Laterality: Left, Lateral Cleanser: Soap and Water 3 x Per Week/30 Days Discharge Instructions: Gently cleanse wound with antibacterial soap,  rinse and pat dry prior to dressing wounds Primary Dressing: Hydrofera Blue Ready Transfer Foam, 2.5x2.5 (in/in) (DME) (Generic) 3 x Per Week/30 Days Discharge Instructions: Apply Hydrofera Blue Ready to wound bed as directed Secondary Dressing: [place] Dressing, 4x4 (in/in) (DME) (Generic) 3 x Per Week/30 Days Discharge Instructions: Apply over dressing to secure in place. 1. Would recommend that we going continue with the wound care measures as before and the patient is in agreement the plan this includes the use of the [facility] which I think is doing an awesome job. Next #2 we will use a [place] island dressing to cover. 3. We will also get a continue with the compression sock which is doing a great job helping to keep the edema under control I think that is also helping with healing in general. We will see patient back for reevaluation in 2 weeks here in the clinic. If anything worsens or changes patient will contact our office for additional recommendations. Electronic Signature(s) Signed: 08/18/2021 3:11:34 PM By: Worthy Keeler PA-C Entered By: Worthy Keeler on 08/18/2021 15:11:33 Crume, Floyde Carlean Jews ([id number]) -------------------------------------------------------------------------------- SuperBill Details Patient Name: [name] Date of Service: 08/18/2021 Medical Record Number: [id number] Patient Account Number: [id number] Date of Birth/Sex: Jun 06, 1961 (60 y.o. M) Treating RN: Dolan Amen Primary Care Provider: PATIENT, NO Other Clinician: Referring Provider: Rosalin Hawking Treating Provider/Extender: Skipper Cliche in Treatment: 5 Diagnosis Coding ICD-10 Codes Code Description I87.2 Venous insufficiency (chronic) (peripheral) L97.822 Non-pressure chronic ulcer of other part of left lower leg with fat layer exposed [place] (primary) hypertension Facility Procedures CPT4 Code: [id number] Description: 58099 - WOUND CARE VISIT-LEV 3 EST PT Modifier: Quantity: 1 Physician Procedures CPT4 Code: 8338250 Description: 53976 - WC PHYS LEVEL 3 - EST PT Modifier: Quantity: 1 CPT4 Code: Description: ICD-10 Diagnosis Description I87.2 Venous insufficiency (chronic) (peripheral) L97.822 Non-pressure chronic ulcer of other part of left lower leg with fat lay I10 Essential (primary) hypertension Modifier: er exposed Quantity: Electronic Signature(s) Signed: 08/18/2021 3:11:51 PM By: Worthy Keeler PA-C Previous Signature: 08/18/2021 12:01:13 PM Version By: Dolan Amen RN Entered By: Worthy Keeler on 08/18/2021 15:11:51

## 2021-08-18 NOTE — Progress Notes (Signed)
[name], [name] ([id number]) Visit Report for 08/18/2021 Arrival Information Details Patient Name: [name], [name]. Date of Service: 08/18/2021 10:45 AM Medical Record Number: [id number] Patient Account Number: [id number] Date of Birth/Sex: Oct 25, 1961 (60 y.o. M) Treating RN: Dolan Amen Primary Care Jailine Lieder: PATIENT, NO Other Clinician: Referring Jeffree Cazeau: Rosalin Hawking Treating Amaiya Scruton/Extender: Skipper Cliche in Treatment: 5 Visit Information History Since Last Visit Pain Present Now: No Patient Arrived: Ambulatory Arrival Time: 10:51 Accompanied By: self Transfer Assistance: None Patient Identification Verified: Yes Secondary Verification Process Completed: Yes Patient Requires Transmission-Based Precautions: No Patient Has Alerts: No Electronic Signature(s) Signed: 08/18/2021 12:01:13 PM By: Dolan Amen RN Entered By: Dolan Amen on 08/18/2021 10:51:40 [name] ([id number]) -------------------------------------------------------------------------------- Clinic Level of Care Assessment Details Patient Name: [name], [name] Date of Service: 08/18/2021 10:45 AM Medical Record Number: [id number] Patient Account Number: [id number] Date of Birth/Sex: October 05, 1961 (60 y.o. M) Treating RN: Dolan Amen Primary Care Ege Muckey: PATIENT, NO Other Clinician: Referring Jaedah Lords: Rosalin Hawking Treating Idara Woodside/Extender: Skipper Cliche in Treatment: 5 Clinic Level of Care Assessment Items TOOL 4 Quantity Score X - Use when only an EandM is performed on FOLLOW-UP visit 1 0 ASSESSMENTS - Nursing Assessment / Reassessment X - Reassessment of Co-morbidities (includes updates in patient status) 1 10 X- 1 5 Reassessment of Adherence to Treatment Plan ASSESSMENTS - Wound and Skin Assessment / Reassessment X - Simple Wound Assessment / Reassessment - one wound 1 5 []  - 0 Complex Wound Assessment / Reassessment - multiple wounds []  -  0 Dermatologic / Skin Assessment (not related to wound area) ASSESSMENTS - Focused Assessment []  - Circumferential Edema Measurements - multi extremities 0 []  - 0 Nutritional Assessment / Counseling / Intervention []  - 0 Lower Extremity Assessment (monofilament, tuning fork, pulses) []  - 0 Peripheral Arterial Disease Assessment (using hand held doppler) ASSESSMENTS - Ostomy and/or Continence Assessment and Care []  - Incontinence Assessment and Management 0 []  - 0 Ostomy Care Assessment and Management (repouching, etc.) PROCESS - Coordination of Care X - Simple Patient / Family Education for ongoing care 1 15 []  - 0 Complex (extensive) Patient / Family Education for ongoing care []  - 0 Staff obtains Programmer, systems, Records, Test Results / Process Orders []  - 0 Staff telephones HHA, Nursing Homes / Clarify orders / etc []  - 0 Routine Transfer to another Facility (non-emergent condition) []  - 0 Routine Hospital Admission (non-emergent condition) []  - 0 New Admissions / Biomedical engineer / Ordering NPWT, Apligraf, etc. []  - 0 Emergency Hospital Admission (emergent condition) X- 1 10 Simple Discharge Coordination []  - 0 Complex (extensive) Discharge Coordination PROCESS - Special Needs []  - Pediatric / Minor Patient Management 0 []  - 0 Isolation Patient Management []  - 0 Hearing / Language / Visual special needs []  - 0 Assessment of Community assistance (transportation, D/C planning, etc.) []  - 0 Additional assistance / Altered mentation []  - 0 Support Surface(s) Assessment (bed, cushion, seat, etc.) INTERVENTIONS - Wound Cleansing / Measurement Newcomer, [name] ([id number]) X- 1 5 Simple Wound Cleansing - one wound []  - 0 Complex Wound Cleansing - multiple wounds X- 1 5 Wound Imaging (photographs - any number of wounds) []  - 0 Wound Tracing (instead of photographs) X- 1 5 Simple Wound Measurement - one wound []  - 0 Complex Wound Measurement - multiple  wounds INTERVENTIONS - Wound Dressings []  - Small Wound Dressing one or multiple wounds 0 X- 1 15 Medium Wound Dressing one or multiple wounds []  - 0 Large Wound Dressing one  or multiple wounds $RemoveBeforeD'[]'MXMTlLkfpkMtBi$  - 0 Application of Medications - topical $RemoveB'[]'hOAuNNXX$  - 0 Application of Medications - injection INTERVENTIONS - Miscellaneous $RemoveBeforeD'[]'qIpHpJlZsuzySl$  - External ear exam 0 $Remo'[]'bUcqz$  - 0 Specimen Collection (cultures, biopsies, blood, body fluids, etc.) $RemoveBefor'[]'cgBSgoAgKREt$  - 0 Specimen(s) / Culture(s) sent or taken to Lab for analysis $RemoveBefo'[]'pVxgrFiSOVu$  - 0 Patient Transfer (multiple staff / Civil Service fast streamer / Similar devices) $RemoveBeforeDE'[]'GdmAOvcbgaFGRbL$  - 0 Simple Staple / Suture removal (25 or less) $Remove'[]'EJxKnrr$  - 0 Complex Staple / Suture removal (26 or more) $Remove'[]'QFLYCMc$  - 0 Hypo / Hyperglycemic Management (close monitor of Blood Glucose) $RemoveBefore'[]'ZvWxQNRrsNLUQ$  - 0 Ankle / Brachial Index (ABI) - do not check if billed separately X- 1 5 Vital Signs Has the patient been seen at the hospital within the last three years: Yes Total Score: 80 Level Of Care: New/Established - Level 3 Electronic Signature(s) Signed: 08/18/2021 12:01:13 PM By: Dolan Amen RN Entered By: Dolan Amen on 08/18/2021 11:14:21 [name] ([id number]) -------------------------------------------------------------------------------- Encounter Discharge Information Details Patient Name: [name], [name] Date of Service: 08/18/2021 10:45 AM Medical Record Number: [id number] Patient Account Number: [id number] Date of Birth/Sex: 01-02-1961 (60 y.o. M) Treating RN: Dolan Amen Primary Care Erlinda Solinger: PATIENT, NO Other Clinician: Referring Emilene Roma: Rosalin Hawking Treating Shenea Giacobbe/Extender: Skipper Cliche in Treatment: 5 Encounter Discharge Information Items Discharge Condition: Stable Ambulatory Status: Ambulatory Discharge Destination: Home Transportation: Private Auto Accompanied By: self Schedule Follow-up Appointment: Yes Clinical Summary of Care: Electronic Signature(s) Signed: 08/18/2021 12:01:13 PM By:  Dolan Amen RN Entered By: Dolan Amen on 08/18/2021 11:15:02 [name] ([id number]) -------------------------------------------------------------------------------- Lower Extremity Assessment Details Patient Name: [name], [name] Date of Service: 08/18/2021 10:45 AM Medical Record Number: [id number] Patient Account Number: [id number] Date of Birth/Sex: May 05, 1961 (60 y.o. M) Treating RN: Dolan Amen Primary Care Timoth Schara: PATIENT, NO Other Clinician: Referring Jameelah Watts: Rosalin Hawking Treating Kaymen Adrian/Extender: Skipper Cliche in Treatment: 5 Edema Assessment Assessed: [Left: Yes] [Right: No] Edema: [Left: Ye] [Right: s] Calf Left: Right: Point of Measurement: 40 cm From Medial Instep 42.2 cm Ankle Left: Right: Point of Measurement: 12 cm From Medial Instep 29 cm Electronic Signature(s) Signed: 08/18/2021 12:01:13 PM By: Dolan Amen RN Entered By: Dolan Amen on 08/18/2021 11:00:55 Slotnick, [place] ([id number]) -------------------------------------------------------------------------------- Multi Wound Chart Details Patient Name: [name], [name] Date of Service: 08/18/2021 10:45 AM Medical Record Number: [id number] Patient Account Number: [id number] Date of Birth/Sex: Apr 15, 1961 (60 y.o. M) Treating RN: Dolan Amen Primary Care Travis Mastel: PATIENT, NO Other Clinician: Referring Sandara Tyree: Rosalin Hawking Treating Urvi Imes/Extender: Skipper Cliche in Treatment: 5 Vital Signs Height(in): 74 Pulse(bpm): 25 Weight(lbs): 294 Blood Pressure(mmHg): 131/82 Body Mass Index(BMI): 38 Temperature(F): 98.6 Respiratory Rate(breaths/min): 18 Photos: [N/A:N/A] Wound Location: Left, Lateral Lower Leg N/A N/A Wounding Event: Gradually Appeared N/A N/A Primary Etiology: Venous Leg Ulcer N/A N/A Comorbid History: Sleep Apnea, Hypertension N/A N/A Date Acquired: 05/09/2021 N/A N/A Weeks of Treatment: 5 N/A N/A Wound Status: Open N/A  N/A Measurements L x W x D (cm) 3x1.5x0.1 N/A N/A Area (cm) : 3.534 N/A N/A Volume (cm) : 0.353 N/A N/A % Reduction in Area: 41.60% N/A N/A % Reduction in Volume: 70.80% N/A N/A Classification: Full Thickness Without Exposed N/A N/A Support Structures Exudate Amount: Medium N/A N/A Exudate Type: Serosanguineous N/A N/A Exudate Color: red, brown N/A N/A Granulation Amount: Large (67-100%) N/A N/A Granulation Quality: Red N/A N/A Necrotic Amount: None Present (0%) N/A N/A Exposed Structures: Fat Layer (Subcutaneous Tissue): N/A N/A Yes Fascia: No Tendon: No Muscle: No Joint: No Bone: No Epithelialization:  Medium (34-66%) N/A N/A Treatment Notes Electronic Signature(s) Signed: 08/18/2021 12:01:13 PM By: Dolan Amen RN Entered By: Dolan Amen on 08/18/2021 11:10:33 [name] ([id number]) -------------------------------------------------------------------------------- [place] Details Patient Name: [name], [name] L. Date of Service: 08/18/2021 10:45 AM Medical Record Number: [id number] Patient Account Number: [id number] Date of Birth/Sex: 1961-04-13 (60 y.o. M) Treating RN: Dolan Amen Primary Care Alixis Harmon: PATIENT, NO Other Clinician: Referring Betzaida Cremeens: Rosalin Hawking Treating Sherrelle Prochazka/Extender: Skipper Cliche in Treatment: 5 Active Inactive Wound/Skin Impairment Nursing Diagnoses: Impaired tissue integrity Goals: Patient/caregiver will verbalize understanding of skin care regimen Date Initiated: 07/10/2021 Date Inactivated: 08/04/2021 Target Resolution Date: 07/10/2021 Goal Status: Met Ulcer/skin breakdown will have a volume reduction of 30% by week 4 Date Initiated: 07/10/2021 Date Inactivated: 08/18/2021 Target Resolution Date: 08/09/2021 Goal Status: Met Ulcer/skin breakdown will have a volume reduction of 50% by week 8 Date Initiated: 07/10/2021 Target Resolution Date: 09/09/2021 Goal Status: Active Ulcer/skin breakdown  will have a volume reduction of 80% by week 12 Date Initiated: 07/10/2021 Target Resolution Date: 10/09/2021 Goal Status: Active Ulcer/skin breakdown will heal within 14 weeks Date Initiated: 07/10/2021 Target Resolution Date: 11/09/2021 Goal Status: Active Interventions: Assess patient/caregiver ability to obtain necessary supplies Assess patient/caregiver ability to perform ulcer/skin care regimen upon admission and as needed Assess ulceration(s) every visit Provide education on smoking Provide education on ulcer and skin care Treatment Activities: Skin care regimen initiated : 07/10/2021 Notes: Electronic Signature(s) Signed: 08/18/2021 12:01:13 PM By: Dolan Amen RN Entered By: Dolan Amen on 08/18/2021 11:10:26 Laskaris, [name] ([id number]) -------------------------------------------------------------------------------- Pain Assessment Details Patient Name: [name], [name] Date of Service: 08/18/2021 10:45 AM Medical Record Number: [id number] Patient Account Number: [id number] Date of Birth/Sex: 02-27-1961 (60 y.o. M) Treating RN: Dolan Amen Primary Care Ryker Pherigo: PATIENT, NO Other Clinician: Referring Jamila Slatten: Rosalin Hawking Treating Earnie Bechard/Extender: Skipper Cliche in Treatment: 5 Active Problems Location of Pain Severity and Description of Pain Patient Has Paino No Site Locations Pain Management and Medication Current Pain Management: Electronic Signature(s) Signed: 08/18/2021 12:01:13 PM By: Dolan Amen RN Entered By: Dolan Amen on 08/18/2021 10:54:08 [name] ([id number]) -------------------------------------------------------------------------------- Patient/Caregiver Education Details Patient Name: [name], [name] Date of Service: 08/18/2021 10:45 AM Medical Record Number: [id number] Patient Account Number: [id number] Date of Birth/Gender: 1961/03/20 (60 y.o. M) Treating RN: Dolan Amen Primary Care Physician: PATIENT,  NO Other Clinician: Referring Physician: Rosalin Hawking Treating Physician/Extender: Skipper Cliche in Treatment: 5 Education Assessment Education Provided To: Patient Education Topics Provided Wound/Skin Impairment: Methods: Explain/Verbal Responses: State content correctly Electronic Signature(s) Signed: 08/18/2021 12:01:13 PM By: Dolan Amen RN Entered By: Dolan Amen on 08/18/2021 11:14:37 [name], [name] ([id number]) -------------------------------------------------------------------------------- Wound Assessment Details Patient Name: [name], [name] Date of Service: 08/18/2021 10:45 AM Medical Record Number: [id number] Patient Account Number: [id number] Date of Birth/Sex: 1960-11-05 (60 y.o. M) Treating RN: Dolan Amen Primary Care Thersea Manfredonia: PATIENT, NO Other Clinician: Referring Amandamarie Feggins: Rosalin Hawking Treating Analeah Brame/Extender: Skipper Cliche in Treatment: 5 Wound Status Wound Number: 1 Primary Etiology: Venous Leg Ulcer Wound Location: Left, Lateral Lower Leg Wound Status: Open Wounding Event: Gradually Appeared Comorbid History: Sleep Apnea, Hypertension Date Acquired: 05/09/2021 Weeks Of Treatment: 5 Clustered Wound: No Photos Wound Measurements Length: (cm) 3 Width: (cm) 1.5 Depth: (cm) 0.1 Area: (cm) 3.534 Volume: (cm) 0.353 % Reduction in Area: 41.6% % Reduction in Volume: 70.8% Epithelialization: Medium (34-66%) Tunneling: No Undermining: No Wound Description Classification: Full Thickness Without Exposed Support Structu Exudate Amount: Medium Exudate Type: Serosanguineous Exudate Color: red, brown res  Foul Odor After Cleansing: No Slough/Fibrino Yes Wound Bed Granulation Amount: Large (67-100%) Exposed Structure Granulation Quality: Red Fascia Exposed: No Necrotic Amount: None Present (0%) Fat Layer (Subcutaneous Tissue) Exposed: Yes Tendon Exposed: No Muscle Exposed: No Joint Exposed: No Bone Exposed:  No Treatment Notes Wound #1 (Lower Leg) Wound Laterality: Left, Lateral Cleanser Soap and Water Discharge Instruction: Gently cleanse wound with antibacterial soap, rinse and pat dry prior to dressing wounds Peri-Wound Care DIMITRIOS, BALESTRIERI ([id number]) Topical Primary Dressing Hydrofera Blue Ready Transfer Foam, 2.5x2.5 (in/in) Discharge Instruction: Apply Hydrofera Blue Ready to wound bed as directed Secondary Dressing [place], 4x4 (in/in) Discharge Instruction: Apply over dressing to secure in place. Secured With Compression Wrap Compression Stockings Add-Ons Electronic Signature(s) Signed: 08/18/2021 12:01:13 PM By: Dolan Amen RN Entered By: Dolan Amen on 08/18/2021 11:10:12 [name] ([id number]) -------------------------------------------------------------------------------- Vitals Details Patient Name: [name], Thanh L. Date of Service: 08/18/2021 10:45 AM Medical Record Number: [id number] Patient Account Number: [id number] Date of Birth/Sex: 13-Feb-1961 (60 y.o. M) Treating RN: Dolan Amen Primary Care Tayjon Halladay: PATIENT, NO Other Clinician: Referring Shandy Vi: Rosalin Hawking Treating Kateria Cutrona/Extender: Skipper Cliche in Treatment: 5 Vital Signs Time Taken: 10:52 Temperature (F): 98.6 Height (in): 74 Pulse (bpm): 56 Weight (lbs): 294 Respiratory Rate (breaths/min): 18 Body Mass Index (BMI): 37.7 Blood Pressure (mmHg): 131/82 Reference Range: 80 - 120 mg / dl Electronic Signature(s) Signed: 08/18/2021 12:01:13 PM By: Dolan Amen RN Entered By: Dolan Amen on 08/18/2021 10:53:50

## 2021-09-01 ENCOUNTER — Encounter: Payer: BC Managed Care – PPO | Admitting: Physician Assistant

## 2021-09-01 ENCOUNTER — Other Ambulatory Visit: Payer: Self-pay

## 2021-09-01 DIAGNOSIS — I872 Venous insufficiency (chronic) (peripheral): Secondary | ICD-10-CM | POA: Diagnosis not present

## 2021-09-01 NOTE — Progress Notes (Addendum)
Philip, [name] ([id number]) Visit Report for 09/01/2021 Chief Complaint Document Details Patient Name: [name], [name]. Date of Service: 09/01/2021 2:45 PM Medical Record Number: [id number] Patient Account Number: [id number] Date of Birth/Sex: 03-02-1961 (60 y.o. M) Treating RN: Donnamarie Poag Primary Care Provider: PATIENT, NO Other Clinician: Referring Provider: Rosalin Hawking Treating Provider/Extender: Skipper Cliche in Treatment: 7 Information Obtained from: Patient Chief Complaint Left LE Ulcer Electronic Signature(s) Signed: 09/01/2021 2:49:37 PM By: Worthy Keeler PA-C Entered By: Worthy Keeler on 09/01/2021 14:49:37 Fahr, Avir Carlean Jews ([id number]) -------------------------------------------------------------------------------- HPI Details Patient Name: [name], [name] Date of Service: 09/01/2021 2:45 PM Medical Record Number: [id number] Patient Account Number: [id number] Date of Birth/Sex: 1961/06/11 (60 y.o. M) Treating RN: Donnamarie Poag Primary Care Provider: PATIENT, NO Other Clinician: Referring Provider: Rosalin Hawking Treating Provider/Extender: Skipper Cliche in Treatment: 7 History of Present Illness HPI Description: 07/10/2021 upon evaluation today patient appears to be doing somewhat poorly in regard to the wound on his left lateral lower extremity. This is a venous leg ulcer. 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 Fortunately there does not appear to be any signs of infection based on what I am seeing right now but he does have some issues noted here with some pain he has not been covering this occasionally he will put a Band-Aid and some Neosporin on but that is about it. He does have a history of hypertension and chronic venous insufficiency no other major medical problems. 07/21/2021 upon evaluation today patient appears to be doing decently  well in regard to his leg ulcer. There is a lot of dry skin around the edges of the wound I am can have to perform sharp debridement to clear this away today. Overall however otherwise I think that he is actually making good progress here and I am very pleased with where things stand. No fevers, chills, nausea, vomiting, or diarrhea. 08/04/2021 upon evaluation today patient's wound actually showing some signs of good improvement which is great news and I am very pleased in that regard. Fortunately there does not appear to be any evidence of active infection which is great news and overall I am extremely pleased with where things stand today. No fevers, chills, nausea, vomiting, or diarrhea. 08/18/2021 upon evaluation today patient appears to be doing well with regard to his wound. I am definitely seeing signs of good improvement which is great news. He has been using the compression sock that seems to be doing a great job for him. Fortunately there does not appear to be any evidence of active infection which is also excellent. 09/01/2021 upon evaluation today patient appears to be doing well with regard to his wound. In fact this appears to be significantly smaller and is getting very close to being completely healed. I do not see any signs of infection right now. Electronic Signature(s) Signed: 09/01/2021 3:01:27 PM By: Worthy Keeler PA-C Entered By: Worthy Keeler on 09/01/2021 15:01:26 [name] ([id number]) -------------------------------------------------------------------------------- Physical Exam Details Patient Name: [name], [name] Date of Service: 09/01/2021 2:45 PM Medical Record Number: [id number] Patient Account Number: [id number] Date of Birth/Sex: 1961-02-19 (60 y.o. M) Treating RN: Donnamarie Poag Primary Care Provider: PATIENT, NO Other Clinician: Referring Provider: Rosalin Hawking Treating Provider/Extender: Skipper Cliche in Treatment:  7 Constitutional Well-nourished and well-hydrated in no acute distress. Respiratory normal breathing without difficulty. Psychiatric this patient is able  to make decisions and demonstrates good insight into disease process. Alert and Oriented x 3. pleasant and cooperative. Notes Patient's wound bed actually showed signs of good granulation epithelization at this point there is little bit of dry skin mechanically remove that with saline and gauze today he tolerated that without complication there was no need for a significant sharp debridement which was also good news. Electronic Signature(s) Signed: 09/01/2021 3:01:47 PM By: Worthy Keeler PA-C Entered By: Worthy Keeler on 09/01/2021 15:01:47 [name] ([id number]) -------------------------------------------------------------------------------- Physician Orders Details Patient Name: [name] Date of Service: 09/01/2021 2:45 PM Medical Record Number: [id number] Patient Account Number: [id number] Date of Birth/Sex: May 23, 1961 (60 y.o. M) Treating RN: Donnamarie Poag Primary Care Provider: PATIENT, NO Other Clinician: Referring Provider: Rosalin Hawking Treating Provider/Extender: Skipper Cliche in Treatment: 7 Verbal / Phone Orders: No Diagnosis Coding ICD-10 Coding Code Description I87.2 Venous insufficiency (chronic) (peripheral) L97.822 Non-pressure chronic ulcer of other part of left lower leg with fat layer exposed [place] (primary) hypertension Follow-up Appointments o Return Appointment in 2 weeks. Bathing/ Shower/ Hygiene o May shower; gently cleanse wound with antibacterial soap, rinse and pat dry prior to dressing wounds - change dressing after shower-keep dry Edema Control - Lymphedema / Segmental Compressive Device / Other o Patient to wear own compression stockings. Remove compression stockings every night before going to bed and put on every morning when getting up. o Elevate,  Exercise Daily and Avoid Standing for Long Periods of Time. o Elevate legs to the level of the heart and pump ankles as often as possible o Elevate leg(s) parallel to the floor when sitting. Additional Orders / Instructions o Follow Nutritious Diet and Increase Protein Intake Wound Treatment Wound #1 - Lower Leg Wound Laterality: Left, Lateral Cleanser: Soap and Water 3 x Per Week/30 Days Discharge Instructions: Gently cleanse wound with antibacterial soap, rinse and pat dry prior to dressing wounds Primary Dressing: Hydrofera Blue Ready Transfer Foam, 2.5x2.5 (in/in) (Generic) 3 x Per Week/30 Days Discharge Instructions: Apply Hydrofera Blue Ready to wound bed as directed Secondary Dressing: [place] Dressing, 4x4 (in/in) (Generic) 3 x Per Week/30 Days Discharge Instructions: Apply over dressing to secure in place. Electronic Signature(s) Signed: 09/01/2021 3:58:49 PM By: Worthy Keeler PA-C Signed: 09/01/2021 4:38:44 PM By: Donnamarie Poag Entered By: Donnamarie Poag on 09/01/2021 14:59:21 Affinito, Santez Carlean Jews ([id number]) -------------------------------------------------------------------------------- Problem List Details Patient Name: HARTOG, Jeyden L. Date of Service: 09/01/2021 2:45 PM Medical Record Number: [id number] Patient Account Number: [id number] Date of Birth/Sex: 1961/02/14 (60 y.o. M) Treating RN: Donnamarie Poag Primary Care Provider: PATIENT, NO Other Clinician: Referring Provider: Rosalin Hawking Treating Provider/Extender: Skipper Cliche in Treatment: 7 Active Problems ICD-10 Encounter Code Description Active Date MDM Diagnosis I87.2 Venous insufficiency (chronic) (peripheral) 07/10/2021 No Yes L97.822 Non-pressure chronic ulcer of other part of left lower leg with fat layer 07/10/2021 No Yes exposed [place] (primary) hypertension 07/10/2021 No Yes Inactive Problems Resolved Problems Electronic Signature(s) Signed: 09/01/2021 2:49:32 PM By:  Worthy Keeler PA-C Entered By: Worthy Keeler on 09/01/2021 14:49:32 Forquer, Marck L. ([id number]) -------------------------------------------------------------------------------- Progress Note Details Patient Name: [name], [name] Date of Service: 09/01/2021 2:45 PM Medical Record Number: [id number] Patient Account Number: [id number] Date of Birth/Sex: 04-13-1961 (60 y.o. M) Treating RN: Donnamarie Poag Primary Care Provider: PATIENT, NO Other Clinician: Referring Provider: Rosalin Hawking Treating Provider/Extender: Skipper Cliche in Treatment: 7 Subjective Chief Complaint Information obtained from Patient Left LE Ulcer History of Present Illness (  HPI) 07/10/2021 upon evaluation today patient appears to be doing somewhat poorly in regard to the wound on his left lateral lower extremity. This is a venous leg ulcer. 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 Fortunately there does not appear to be any signs of infection based on what I am seeing right now but he does have some issues noted here with some pain he has not been covering this occasionally he will put a Band-Aid and some Neosporin on but that is about it. He does have a history of hypertension and chronic venous insufficiency no other major medical problems. 07/21/2021 upon evaluation today patient appears to be doing decently well in regard to his leg ulcer. There is a lot of dry skin around the edges of the wound I am can have to perform sharp debridement to clear this away today. Overall however otherwise I think that he is actually making good progress here and I am very pleased with where things stand. No fevers, chills, nausea, vomiting, or diarrhea. 08/04/2021 upon evaluation today patient's wound actually showing some signs of good improvement which is great news and I am very pleased in that regard. Fortunately there  does not appear to be any evidence of active infection which is great news and overall I am extremely pleased with where things stand today. No fevers, chills, nausea, vomiting, or diarrhea. 08/18/2021 upon evaluation today patient appears to be doing well with regard to his wound. I am definitely seeing signs of good improvement which is great news. He has been using the compression sock that seems to be doing a great job for him. Fortunately there does not appear to be any evidence of active infection which is also excellent. 09/01/2021 upon evaluation today patient appears to be doing well with regard to his wound. In fact this appears to be significantly smaller and is getting very close to being completely healed. I do not see any signs of infection right now. Objective Constitutional Well-nourished and well-hydrated in no acute distress. Vitals Time Taken: 2:40 PM, Height: 74 in, Weight: 294 lbs, BMI: 37.7, Temperature: 97.9 F, Pulse: 57 bpm, Respiratory Rate: 16 breaths/min, Blood Pressure: 136/75 mmHg. Respiratory normal breathing without difficulty. Psychiatric this patient is able to make decisions and demonstrates good insight into disease process. Alert and Oriented x 3. pleasant and cooperative. General Notes: Patient's wound bed actually showed signs of good granulation epithelization at this point there is little bit of dry skin mechanically remove that with saline and gauze today he tolerated that without complication there was no need for a significant sharp debridement which was also good news. Integumentary (Hair, Skin) Wound #1 status is Open. Original cause of wound was Gradually Appeared. The date acquired was: 05/09/2021. The wound has been in treatment 7 weeks. The wound is located on the Left,Lateral Lower Leg. The wound measures 0.3cm length x 0.2cm width x 0.1cm depth; 0.047cm^2 area and 0.005cm^3 volume. There is Fat Layer (Subcutaneous Tissue) exposed. There is no  tunneling or undermining noted. There is a medium amount of serosanguineous drainage noted. There is large (67-100%) red granulation within the wound bed. There is a small (1-33%) amount of necrotic tissue within the wound bed including Adherent Slough. KUTLER, VANVRANKEN ([id number]) Assessment Active Problems ICD-10 Venous insufficiency (chronic) (peripheral) Non-pressure chronic ulcer of other part of left lower leg with fat layer exposed [place] (primary) hypertension  Plan Follow-up Appointments: Return Appointment in 2 weeks. Bathing/ Shower/ Hygiene: May shower; gently cleanse wound with antibacterial soap, rinse and pat dry prior to dressing wounds - change dressing after shower-keep dry Edema Control - Lymphedema / Segmental Compressive Device / Other: Patient to wear own compression stockings. Remove compression stockings every night before going to bed and put on every morning when getting up. Elevate, Exercise Daily and Avoid Standing for Long Periods of Time. Elevate legs to the level of the heart and pump ankles as often as possible Elevate leg(s) parallel to the floor when sitting. Additional Orders / Instructions: Follow Nutritious Diet and Increase Protein Intake WOUND #1: - Lower Leg Wound Laterality: Left, Lateral Cleanser: Soap and Water 3 x Per Week/30 Days Discharge Instructions: Gently cleanse wound with antibacterial soap, rinse and pat dry prior to dressing wounds Primary Dressing: Hydrofera Blue Ready Transfer Foam, 2.5x2.5 (in/in) (Generic) 3 x Per Week/30 Days Discharge Instructions: Apply Hydrofera Blue Ready to wound bed as directed Secondary Dressing: [place] Dressing, 4x4 (in/in) (Generic) 3 x Per Week/30 Days Discharge Instructions: Apply over dressing to secure in place. 1. I would recommend currently that we going continue with the wound care measures as before and the patient is in agreement with plan this includes the use of the  [facility] dressing which is doing a great job. 2. I am also can recommend that he continue with his compression sock which I think is doing awesome for him. 3. I would recommend he continue to change this every other day which is ideal. We will see patient back for reevaluation in 2 weeks here in the clinic. If anything worsens or changes patient will contact our office for additional recommendations. Electronic Signature(s) Signed: 09/01/2021 3:02:15 PM By: Worthy Keeler PA-C Entered By: Worthy Keeler on 09/01/2021 15:02:15 Lucci, Cheyenne Adas ([id number]) -------------------------------------------------------------------------------- SuperBill Details Patient Name: [name] Date of Service: 09/01/2021 Medical Record Number: [id number] Patient Account Number: [id number] Date of Birth/Sex: [birth year] (60 y.o. M) Treating RN: Donnamarie Poag Primary Care Provider: PATIENT, NO Other Clinician: Referring Provider: Rosalin Hawking Treating Provider/Extender: Skipper Cliche in Treatment: 7 Diagnosis Coding ICD-10 Codes Code Description I87.2 Venous insufficiency (chronic) (peripheral) L97.822 Non-pressure chronic ulcer of other part of left lower leg with fat layer exposed [place] (primary) hypertension Facility Procedures CPT4 Code: [id number] Description: [id number] - WOUND CARE VISIT-LEV 2 EST PT Modifier: Quantity: 1 Physician Procedures CPT4 Code: [id number] Description: 27782 - WC PHYS LEVEL 3 - EST PT Modifier: Quantity: 1 CPT4 Code: Description: ICD-10 Diagnosis Description I87.2 Venous insufficiency (chronic) (peripheral) L97.822 Non-pressure chronic ulcer of other part of left lower leg with fat lay I10 Essential (primary) hypertension Modifier: er exposed Quantity: Electronic Signature(s) Signed: 09/01/2021 3:02:26 PM By: Worthy Keeler PA-C Entered By: Worthy Keeler on 09/01/2021 15:02:26

## 2021-09-01 NOTE — Progress Notes (Signed)
[name] ([id number]) Visit Report for 09/01/2021 Arrival Information Details Patient Name: [name], [name]. Date of Service: 09/01/2021 2:45 PM Medical Record Number: [id number] Patient Account Number: [id number] Date of Birth/Sex: 04/22/1961 (60 y.o. M) Treating RN: [name] Primary Care [name]: PATIENT, NO Other Clinician: Referring [name]: [name] Treating [name]/Extender: [name] in Treatment: 7 Visit Information History Since Last Visit Added or deleted any medications: No Patient Arrived: Ambulatory Had a fall or experienced change in No Arrival Time: 14:39 activities of daily living that may affect Accompanied By: self risk of falls: Transfer Assistance: None Hospitalized since last visit: No Patient Identification Verified: Yes Has Dressing in Place as Prescribed: Yes Secondary Verification Process Completed: Yes Pain Present Now: No Patient Requires Transmission-Based Precautions: No Patient Has Alerts: No Electronic Signature(s) Signed: 09/01/2021 4:38:44 PM By: [name] Entered By: [name] on 09/01/2021 14:39:25 [name], [name] ([id number]) -------------------------------------------------------------------------------- Clinic Level of Care Assessment Details Patient Name: [name], [name]. Date of Service: 09/01/2021 2:45 PM Medical Record Number: [id number] Patient Account Number: [id number] Date of Birth/Sex: 10-08-1961 (60 y.o. M) Treating RN: [name] Primary Care [name]: PATIENT, NO Other Clinician: Referring [name]: [name] Treating [name]/Extender: [name] in Treatment: 7 Clinic Level of Care Assessment Items TOOL 4 Quantity Score _0  - Use when only an EandM is performed on FOLLOW-UP visit 0 ASSESSMENTS - Nursing Assessment / Reassessment _1  - Reassessment of Co-morbidities (includes updates in patient status) 0 _2  - 0 Reassessment of Adherence to Treatment  Plan ASSESSMENTS - Wound and Skin Assessment / Reassessment X - Simple Wound Assessment / Reassessment - one wound 1 5 _3  - 0 Complex Wound Assessment / Reassessment - multiple wounds _4  - 0 Dermatologic / Skin Assessment (not related to wound area) ASSESSMENTS - Focused Assessment _5  - Circumferential Edema Measurements - multi extremities 0 _6  - 0 Nutritional Assessment / Counseling / Intervention _7  - 0 Lower Extremity Assessment (monofilament, tuning fork, pulses) _8  - 0 Peripheral Arterial Disease Assessment (using hand held doppler) ASSESSMENTS - Ostomy and/or Continence Assessment and Care _9  - Incontinence Assessment and Management 0 _10  - 0 Ostomy Care Assessment and Management (repouching, etc.) PROCESS - Coordination of Care X - Simple Patient / Family Education for ongoing care 1 15 _11  - 0 Complex (extensive) Patient / Family Education for ongoing care _12  - 0 Staff obtains Programmer, systems, Records, Test Results / Process Orders _13  - 0 Staff telephones HHA, Nursing Homes / Clarify orders / etc _14  - 0 Routine Transfer to another Facility (non-emergent condition) _15  - 0 Routine Hospital Admission (non-emergent condition) _16  - 0 New Admissions / Biomedical engineer / Ordering NPWT, Apligraf, etc. _17  - 0 Emergency Hospital Admission (emergent condition) X- 1 10 Simple Discharge Coordination _18  - 0 Complex (extensive) Discharge Coordination PROCESS - Special Needs _19  - Pediatric / Minor Patient Management 0 _20  - 0 Isolation Patient Management _21  - 0 Hearing / Language / Visual special needs _22  - 0 Assessment of Community assistance (transportation, D/C planning, etc.) _23  - 0 Additional assistance / Altered mentation _24  - 0 Support Surface(s) Assessment (bed, cushion, seat, etc.) INTERVENTIONS - Wound Cleansing / Measurement [name]. ([id number]) X- 1 5 Simple Wound Cleansing - one wound _25  - 0 Complex Wound Cleansing - multiple wounds X- 1  5 Wound Imaging (photographs - any number of wounds) _26  - 0 Wound Tracing (instead of photographs) X- 1 5 Simple Wound Measurement - one wound _27  - 0 Complex Wound Measurement -  multiple wounds INTERVENTIONS - Wound Dressings X - Small Wound Dressing one or multiple wounds 1 10 _0  - 0 Medium Wound Dressing one or multiple wounds _1  - 0 Large Wound Dressing one or multiple wounds X- 1 5 Application of Medications - topical <YQMVHQIONGEXBMWU>_1<\/[id number]>_0  - 0 Application of Medications - injection INTERVENTIONS - Miscellaneous _3  - External ear exam 0 _4  - 0 Specimen Collection (cultures, biopsies, blood, body fluids, etc.) _5  - 0 Specimen(s) / Culture(s) sent or taken to Lab for analysis _6  - 0 Patient Transfer (multiple staff / Harrel Lemon Lift / Similar devices) _7  - 0 Simple Staple / Suture removal (25 or less) _8  - 0 Complex Staple / Suture removal (26 or more) _9  - 0 Hypo / Hyperglycemic Management (close monitor of Blood Glucose) _10  - 0 Ankle / Brachial Index (ABI) - do not check if billed separately X- 1 5 Vital Signs Has the patient been seen at the hospital within the last three years: Yes Total Score: 65 Level Of Care: New/Established - Level 2 Electronic Signature(s) Signed: 09/01/2021 4:38:44 PM By: [name] Entered By: [name] on 09/01/2021 14:59:59 Raphael, [name] ([id number]) -------------------------------------------------------------------------------- Encounter Discharge Information Details Patient Name: [name], Zavier [name]. Date of Service: 09/01/2021 2:45 PM Medical Record Number: [id number] Patient Account Number: [id number] Date of Birth/Sex: [birth year] (60 y.o. M) Treating RN: [name] Primary Care Mayumi Summerson: PATIENT, NO Other Clinician: Referring Tzion Wedel: [name] Treating Sidnee Gambrill/Extender: [name] in Treatment: 7 Encounter Discharge Information Items Discharge Condition: Stable Ambulatory Status: Ambulatory Discharge Destination:  Home Transportation: Private Auto Accompanied By: self Schedule Follow-up Appointment: Yes Clinical Summary of Care: Electronic Signature(s) Signed: 09/01/2021 4:38:44 PM By: [name] Entered By: [name] on 09/01/2021 15:03:55 [name], [name] ([id number]) -------------------------------------------------------------------------------- Lower Extremity Assessment Details Patient Name: [name], Jonhatan [name]. Date of Service: 09/01/2021 2:45 PM Medical Record Number: [id number] Patient Account Number: [id number] Date of Birth/Sex: 06/27/1961 (60 y.o. M) Treating RN: [name] Primary Care Perley Arthurs: PATIENT, NO Other Clinician: Referring Merville Hijazi: [name] Treating Ayanni Tun/Extender: [name] in Treatment: 7 Edema Assessment Assessed: [Left: No] [Right: No] [Left: Edema] [Right: :] Calf Left: Right: Point of Measurement: 40 cm From Medial Instep 43 cm Ankle Left: Right: Point of Measurement: 12 cm From Medial Instep 30 cm Vascular Assessment Pulses: Dorsalis Pedis Palpable: [Left:Yes] Electronic Signature(s) Signed: 09/01/2021 4:38:44 PM By: [name] Entered By: [name] on 09/01/2021 14:45:04 [name], [name]. ([id number]) -------------------------------------------------------------------------------- Multi Wound Chart Details Patient Name: [name], Branon [name]. Date of Service: 09/01/2021 2:45 PM Medical Record Number: [id number] Patient Account Number: [id number] Date of Birth/Sex: [birth year] (60 y.o. M) Treating RN: [name] Primary Care Kilah Drahos: PATIENT, NO Other Clinician: Referring Briell Paulette: [name] Treating Dezmen Alcock/Extender: [name] in Treatment: 7 Vital Signs Height(in): 74 Pulse(bpm): 25 Weight(lbs): 294 Blood Pressure(mmHg): 136/75 Body Mass Index(BMI): 38 Temperature(F): 97.9 Respiratory Rate(breaths/min): 16 Photos: [N/A:N/A] Wound Location: Left, Lateral Lower Leg N/A N/A Wounding Event:  Gradually Appeared N/A N/A Primary Etiology: Venous Leg Ulcer N/A N/A Comorbid History: Sleep Apnea, Hypertension N/A N/A Date Acquired: 05/09/2021 N/A N/A Weeks of Treatment: 7 N/A N/A Wound Status: Open N/A N/A Measurements [name] x W x D (cm) 0.3x0.2x0.1 N/A N/A Area (cm) : 0.047 N/A N/A Volume (cm) : 0.005 N/A N/A % Reduction in Area: 99.20% N/A N/A % Reduction in Volume: 99.60% N/A N/A Classification: Full Thickness Without Exposed N/A N/A Support Structures Exudate Amount: Medium N/A N/A Exudate Type: Serosanguineous N/A N/A Exudate Color: red, brown N/A N/A Granulation  Amount: Large (67-100%) N/A N/A Granulation Quality: Red N/A N/A Necrotic Amount: Small (1-33%) N/A N/A Exposed Structures: Fat Layer (Subcutaneous Tissue): N/A N/A Yes Fascia: No Tendon: No Muscle: No Joint: No Bone: No Epithelialization: Medium (34-66%) N/A N/A Treatment Notes Electronic Signature(s) Signed: 09/01/2021 4:38:44 PM By: [name] Entered By: [name] on 09/01/2021 14:45:49 [name], [name] ([id number]) -------------------------------------------------------------------------------- [place] Details Patient Name: Philip Rower [name]. Date of Service: 09/01/2021 2:45 PM Medical Record Number: [id number] Patient Account Number: [id number] Date of Birth/Sex: 05-01-1961 (60 y.o. M) Treating RN: [name] Primary Care Ignazio Kincaid: PATIENT, NO Other Clinician: Referring Akeia Perot: [name] Treating Frandy Basnett/Extender: [name] in Treatment: 7 Active Inactive Wound/Skin Impairment Nursing Diagnoses: Impaired tissue integrity Goals: Patient/caregiver will verbalize understanding of skin care regimen Date Initiated: 07/10/2021 Date Inactivated: 08/04/2021 Target Resolution Date: 07/10/2021 Goal Status: Met Ulcer/skin breakdown will have a volume reduction of 30% by week 4 Date Initiated: 07/10/2021 Date Inactivated: 08/18/2021 Target Resolution Date:  08/09/2021 Goal Status: Met Ulcer/skin breakdown will have a volume reduction of 50% by week 8 Date Initiated: 07/10/2021 Date Inactivated: 09/01/2021 Target Resolution Date: 09/09/2021 Goal Status: Met Ulcer/skin breakdown will have a volume reduction of 80% by week 12 Date Initiated: 07/10/2021 Target Resolution Date: 10/09/2021 Goal Status: Active Ulcer/skin breakdown will heal within 14 weeks Date Initiated: 07/10/2021 Target Resolution Date: 11/09/2021 Goal Status: Active Interventions: Assess patient/caregiver ability to obtain necessary supplies Assess patient/caregiver ability to perform ulcer/skin care regimen upon admission and as needed Assess ulceration(s) every visit Provide education on smoking Provide education on ulcer and skin care Treatment Activities: Skin care regimen initiated : 07/10/2021 Notes: Electronic Signature(s) Signed: 09/01/2021 4:38:44 PM By: [name] Entered By: [name] on 09/01/2021 14:45:14 [name], [name]. ([id number]) -------------------------------------------------------------------------------- Pain Assessment Details Patient Name: [name], Foster [name]. Date of Service: 09/01/2021 2:45 PM Medical Record Number: [id number] Patient Account Number: [id number] Date of Birth/Sex: Oct 15, 1961 (60 y.o. M) Treating RN: [name] Primary Care Parthenia Tellefsen: PATIENT, NO Other Clinician: Referring Ellery Tash: [name] Treating Kathi Dohn/Extender: [name] in Treatment: 7 Active Problems Location of Pain Severity and Description of Pain Patient Has Paino No Site Locations Rate the pain. Current Pain Level: 0 Pain Management and Medication Current Pain Management: Electronic Signature(s) Signed: 09/01/2021 4:38:44 PM By: [name] Entered By: [name] on 09/01/2021 14:42:17 [name], [name] ([id number]) -------------------------------------------------------------------------------- Patient/Caregiver Education Details Patient  Name: [name], Herman [name]. Date of Service: 09/01/2021 2:45 PM Medical Record Number: [id number] Patient Account Number: [id number] Date of Birth/Gender: Jan 11, 1961 (60 y.o. M) Treating RN: [name] Primary Care Physician: PATIENT, NO Other Clinician: Referring Physician: Rosalin [name] Treating Physician/Extender: [name] in Treatment: 7 Education Assessment Education Provided To: Patient Education Topics Provided Basic Hygiene: Nutrition: Smoking and Wound Healing: Wound Debridement: Wound/Skin Impairment: Electronic Signature(s) Signed: 09/01/2021 4:38:44 PM By: [name] Entered By: [name] on 09/01/2021 15:00:51 [name], [name] ([id number]) -------------------------------------------------------------------------------- Wound Assessment Details Patient Name: [name], [name]. Date of Service: 09/01/2021 2:45 PM Medical Record Number: [id number] Patient Account Number: [id number] Date of Birth/Sex: [birth year] (60 y.o. M) Treating RN: [name] Primary Care Jayvan Mcshan: PATIENT, NO Other Clinician: Referring Kayline Sheer: [name] Treating Tamarick Kovalcik/Extender: [name] in Treatment: 7 Wound Status Wound Number: 1 Primary Etiology: Venous Leg Ulcer Wound Location: Left, Lateral Lower Leg Wound Status: Open Wounding Event: Gradually Appeared Comorbid History: Sleep Apnea, Hypertension Date Acquired: 05/09/2021 Weeks Of Treatment: 7 Clustered Wound: No Photos Wound Measurements Length: (cm) 0.3 Width: (cm) 0.2 Depth: (  cm) 0.1 Area: (cm) 0.047 Volume: (cm) 0.005 % Reduction in Area: 99.2% % Reduction in Volume: 99.6% Epithelialization: Medium (34-66%) Tunneling: No Undermining: No Wound Description Classification: Full Thickness Without Exposed Support Structu Exudate Amount: Medium Exudate Type: Serosanguineous Exudate Color: red, brown res Foul Odor After Cleansing: No Slough/Fibrino Yes Wound Bed Granulation Amount:  Large (67-100%) Exposed Structure Granulation Quality: Red Fascia Exposed: No Necrotic Amount: Small (1-33%) Fat Layer (Subcutaneous Tissue) Exposed: Yes Necrotic Quality: Adherent Slough Tendon Exposed: No Muscle Exposed: No Joint Exposed: No Bone Exposed: No Treatment Notes Wound #1 (Lower Leg) Wound Laterality: Left, Lateral Cleanser Soap and Water Discharge Instruction: Gently cleanse wound with antibacterial soap, rinse and pat dry prior to dressing wounds Peri-Wound Care [name], SHEELER ([id number]) Topical Primary Dressing Hydrofera Blue Ready Transfer Foam, 2.5x2.5 (in/in) Discharge Instruction: Apply Hydrofera Blue Ready to wound bed as directed Secondary Dressing [place], 4x4 (in/in) Discharge Instruction: Apply over dressing to secure in place. Secured With Compression Wrap Compression Stockings Add-Ons Electronic Signature(s) Signed: 09/01/2021 4:38:44 PM By: [name] Entered By: [name] on 09/01/2021 14:44:42 Mickelsen, Royston LMarland Kitchen ([id number]) -------------------------------------------------------------------------------- Vitals Details Patient Name: [name], Abdulla [name]. Date of Service: 09/01/2021 2:45 PM Medical Record Number: [id number] Patient Account Number: [id number] Date of Birth/Sex: 11-10-1960 (60 y.o. M) Treating RN: [name] Primary Care Ryelynn Guedea: PATIENT, NO Other Clinician: Referring Yezenia Fredrick: [name] Treating Ariyon Gerstenberger/Extender: [name] in Treatment: 7 Vital Signs Time Taken: 14:40 Temperature (F): 97.9 Height (in): 74 Pulse (bpm): 57 Weight (lbs): 294 Respiratory Rate (breaths/min): 16 Body Mass Index (BMI): 37.7 Blood Pressure (mmHg): 136/75 Reference Range: 80 - 120 mg / dl Electronic Signature(s) Signed: 09/01/2021 4:38:44 PM By: [name] Entered ByDonnamarie [name] on 09/01/2021 14:42:09

## 2021-09-18 ENCOUNTER — Other Ambulatory Visit: Payer: Self-pay

## 2021-09-18 ENCOUNTER — Encounter: Payer: BC Managed Care – PPO | Attending: Physician Assistant | Admitting: Physician Assistant

## 2021-09-18 DIAGNOSIS — I872 Venous insufficiency (chronic) (peripheral): Secondary | ICD-10-CM | POA: Insufficient documentation

## 2021-09-18 DIAGNOSIS — I10 Essential (primary) hypertension: Secondary | ICD-10-CM | POA: Diagnosis not present

## 2021-09-18 DIAGNOSIS — L97822 Non-pressure chronic ulcer of other part of left lower leg with fat layer exposed: Secondary | ICD-10-CM | POA: Insufficient documentation

## 2021-09-18 NOTE — Progress Notes (Addendum)
DEMETRIC, DUNNAWAY ([id number]) Visit Report for 09/18/2021 Chief Complaint Document Details Patient Name: [name], [name]. Date of Service: 09/18/2021 2:00 PM Medical Record Number: [id number] Patient Account Number: [id number] Date of Birth/Sex: 09/15/1961 (60 y.o. M) Treating [name]: Cornell Barman Primary Care Provider: PATIENT, NO Other Clinician: Referring Provider: Rosalin Hawking Treating Provider/Extender: Skipper Cliche in Treatment: 10 Information Obtained from: Patient Chief Complaint Left LE Ulcer Electronic Signature(s) Signed: 09/18/2021 2:34:14 PM By: Worthy Keeler PA-C Entered By: Worthy Keeler on 09/18/2021 14:34:14 [place], [place]. ([id number]) -------------------------------------------------------------------------------- HPI Details Patient Name: [name], [name] Date of Service: 09/18/2021 2:00 PM Medical Record Number: [id number] Patient Account Number: [id number] Date of Birth/Sex: 1961/07/12 (60 y.o. M) Treating [name]: Cornell Barman Primary Care Provider: PATIENT, NO Other Clinician: Referring Provider: Rosalin Hawking Treating Provider/Extender: Skipper Cliche in Treatment: 10 History of Present Illness HPI Description: 07/10/2021 upon evaluation today patient appears to be doing somewhat poorly in regard to the wound on his left lateral lower extremity. This is a venous leg ulcer. 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 Fortunately there does not appear to be any signs of infection based on what I am seeing right now but he does have some issues noted here with some pain he has not been covering this occasionally he will put a Band-Aid and some Neosporin on but that is about it. He does have a history of hypertension and chronic venous insufficiency no other major medical problems. 07/21/2021 upon evaluation today patient appears to be doing decently  well in regard to his leg ulcer. There is a lot of dry skin around the edges of the wound I am can have to perform sharp debridement to clear this away today. Overall however otherwise I think that he is actually making good progress here and I am very pleased with where things stand. No fevers, chills, nausea, vomiting, or diarrhea. 08/04/2021 upon evaluation today patient's wound actually showing some signs of good improvement which is great news and I am very pleased in that regard. Fortunately there does not appear to be any evidence of active infection which is great news and overall I am extremely pleased with where things stand today. No fevers, chills, nausea, vomiting, or diarrhea. 08/18/2021 upon evaluation today patient appears to be doing well with regard to his wound. I am definitely seeing signs of good improvement which is great news. He has been using the compression sock that seems to be doing a great job for him. Fortunately there does not appear to be any evidence of active infection which is also excellent. 09/01/2021 upon evaluation today patient appears to be doing well with regard to his wound. In fact this appears to be significantly smaller and is getting very close to being completely healed. I do not see any signs of infection right now. 09/18/2021 upon evaluation today patient appears to be doing well with regard to his wound. In fact this appears to be completely healed which is great news. I do not see any signs of infection currently. Electronic Signature(s) Signed: 09/18/2021 4:55:48 PM By: Worthy Keeler PA-C Entered By: Worthy Keeler on 09/18/2021 16:55:47 [name] ([id number]) -------------------------------------------------------------------------------- Physical Exam Details Patient Name: FLATER, [name] Date of Service: 09/18/2021 2:00 PM Medical Record Number: [id number] Patient Account Number: [id number] Date of Birth/Sex: 11-Sep-1961 (60  y.o. M) Treating [name]: Cornell Barman Primary  Care Provider: PATIENT, NO Other Clinician: Referring Provider: Rosalin Hawking Treating Provider/Extender: Skipper Cliche in Treatment: 53 Constitutional Well-nourished and well-hydrated in no acute distress. Respiratory normal breathing without difficulty. Psychiatric this patient is able to make decisions and demonstrates good insight into disease process. Alert and Oriented x 3. pleasant and cooperative. Notes Patient's wound again showed signs of complete epithelization at this time and also anything open and right now I think the biggest thing is he needs to continue wearing the compression socks other not I think that he is doing excellent. Electronic Signature(s) Signed: 09/18/2021 4:57:51 PM By: Worthy Keeler PA-C Entered By: Worthy Keeler on 09/18/2021 16:57:51 [name], [name] ([id number]) -------------------------------------------------------------------------------- Physician Orders Details Patient Name: Philip Rower L. Date of Service: 09/18/2021 2:00 PM Medical Record Number: [id number] Patient Account Number: [id number] Date of Birth/Sex: 04/21/1961 (60 y.o. M) Treating [name]: Cornell Barman Primary Care Provider: PATIENT, NO Other Clinician: Referring Provider: Rosalin Hawking Treating Provider/Extender: Skipper Cliche in Treatment: 10 Verbal / Phone Orders: No Diagnosis Coding ICD-10 Coding Code Description I87.2 Venous insufficiency (chronic) (peripheral) L97.822 Non-pressure chronic ulcer of other part of left lower leg with fat layer exposed I10 Essential (primary) hypertension Discharge From [facility] Services o Discharge from [place] Treatment Complete - treatment complete o Wear compression garments daily. Put garments on first thing when you wake up and remove them before bed. o Moisturize legs daily after removing compression garments. o Elevate, Exercise Daily and Avoid Standing  for Long Periods of Time. Electronic Signature(s) Signed: 09/18/2021 5:41:09 PM By: Worthy Keeler PA-C Signed: 09/18/2021 6:31:02 PM By: Gretta Cool, BSN, [name], [name], [name], [name] Entered By: Gretta Cool, BSN, [name], [name], Philip on 09/18/2021 14:40:10 [name] ([id number]) -------------------------------------------------------------------------------- Problem List Details Patient Name: [name], [name] Date of Service: 09/18/2021 2:00 PM Medical Record Number: [id number] Patient Account Number: [id number] Date of Birth/Sex: [birth year] (60 y.o. M) Treating [name]: Cornell Barman Primary Care Provider: PATIENT, NO Other Clinician: Referring Provider: Rosalin Hawking Treating Provider/Extender: Skipper Cliche in Treatment: 10 Active Problems ICD-10 Encounter Code Description Active Date MDM Diagnosis I87.2 Venous insufficiency (chronic) (peripheral) 07/10/2021 No Yes L97.822 Non-pressure chronic ulcer of other part of left lower leg with fat layer 07/10/2021 No Yes exposed [place] (primary) hypertension 07/10/2021 No Yes Inactive Problems Resolved Problems Electronic Signature(s) Signed: 09/18/2021 2:34:03 PM By: Worthy Keeler PA-C Entered By: Worthy Keeler on 09/18/2021 14:34:03 Kushner, [name] ([id number]) -------------------------------------------------------------------------------- Progress Note Details Patient Name: [name], [name] Date of Service: 09/18/2021 2:00 PM Medical Record Number: [id number] Patient Account Number: [id number] Date of Birth/Sex: 1961/03/09 (60 y.o. M) Treating [name]: Cornell Barman Primary Care Provider: PATIENT, NO Other Clinician: Referring Provider: Rosalin Hawking Treating Provider/Extender: Skipper Cliche in Treatment: 10 Subjective Chief Complaint Information obtained from Patient Left LE Ulcer History of Present Illness (HPI) 07/10/2021 upon evaluation today patient appears to be doing somewhat poorly in regard to the wound on his left  lateral lower extremity. This is a venous leg ulcer. 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 Fortunately there does not appear to be any signs of infection based on what I am seeing right now but he does have some issues noted here with some pain he has not been covering this occasionally he will put a Band-Aid and some Neosporin on but that is about it. He does have a history  of hypertension and chronic venous insufficiency no other major medical problems. 07/21/2021 upon evaluation today patient appears to be doing decently well in regard to his leg ulcer. There is a lot of dry skin around the edges of the wound I am can have to perform sharp debridement to clear this away today. Overall however otherwise I think that he is actually making good progress here and I am very pleased with where things stand. No fevers, chills, nausea, vomiting, or diarrhea. 08/04/2021 upon evaluation today patient's wound actually showing some signs of good improvement which is great news and I am very pleased in that regard. Fortunately there does not appear to be any evidence of active infection which is great news and overall I am extremely pleased with where things stand today. No fevers, chills, nausea, vomiting, or diarrhea. 08/18/2021 upon evaluation today patient appears to be doing well with regard to his wound. I am definitely seeing signs of good improvement which is great news. He has been using the compression sock that seems to be doing a great job for him. Fortunately there does not appear to be any evidence of active infection which is also excellent. 09/01/2021 upon evaluation today patient appears to be doing well with regard to his wound. In fact this appears to be significantly smaller and is getting very close to being completely healed. I do not see any signs of infection right now. 09/18/2021 upon  evaluation today patient appears to be doing well with regard to his wound. In fact this appears to be completely healed which is great news. I do not see any signs of infection currently. Objective Constitutional Well-nourished and well-hydrated in no acute distress. Vitals Time Taken: 2:31 PM, Height: 74 in, Weight: 294 lbs, BMI: 37.7, Temperature: 98 F, Pulse: 56 bpm, Respiratory Rate: 18 breaths/min, Blood Pressure: 169/80 mmHg. Respiratory normal breathing without difficulty. Psychiatric this patient is able to make decisions and demonstrates good insight into disease process. Alert and Oriented x 3. pleasant and cooperative. General Notes: Patient's wound again showed signs of complete epithelization at this time and also anything open and right now I think the biggest thing is he needs to continue wearing the compression socks other not I think that he is doing excellent. Integumentary (Hair, Skin) Wound #1 status is Healed - Epithelialized. Original cause of wound was Gradually Appeared. The date acquired was: 05/09/2021. The wound has been in treatment 10 weeks. The wound is located on the Left,Lateral Lower Leg. The wound measures 0cm length x 0cm width x 0cm depth; 0cm^2 area and 0cm^3 volume. There is a medium amount of serosanguineous drainage noted. [name], [name] ([id number]) Assessment Active Problems ICD-10 Venous insufficiency (chronic) (peripheral) Non-pressure chronic ulcer of other part of left lower leg with fat layer exposed Essential (primary) hypertension Plan Discharge From [facility] Services: Discharge from [place] Treatment Complete - treatment complete Wear compression garments daily. Put garments on first thing when you wake up and remove them before bed. Moisturize legs daily after removing compression garments. Elevate, Exercise Daily and Avoid Standing for Long Periods of Time. 1. Would recommend that we go ahead and discontinue wound care  services as the patient appears to be completely healed I am very pleased with where things stand. 2. He needs to continue to wear the compression socks I think they are doing a great job for him and as long as he keeps that in place I think this will keep it under good control. We  will see the patient back for follow-up visit as needed in the future. Electronic Signature(s) Signed: 09/18/2021 4:58:17 PM By: Worthy Keeler PA-C Entered By: Worthy Keeler on 09/18/2021 16:58:16 Dykstra, [name] ([id number]) -------------------------------------------------------------------------------- SuperBill Details Patient Name: Philip Rower L. Date of Service: 09/18/2021 Medical Record Number: [id number] Patient Account Number: [id number] Date of Birth/Sex: Nov 06, 1960 (60 y.o. M) Treating [name]: Cornell Barman Primary Care Provider: PATIENT, NO Other Clinician: Referring Provider: Rosalin Hawking Treating Provider/Extender: Skipper Cliche in Treatment: 10 Diagnosis Coding ICD-10 Codes Code Description I87.2 Venous insufficiency (chronic) (peripheral) L97.822 Non-pressure chronic ulcer of other part of left lower leg with fat layer exposed [place] (primary) hypertension Facility Procedures CPT4 Code: [id number] Description: [id number] - WOUND CARE VISIT-LEV 2 EST PT Modifier: Quantity: 1 Physician Procedures CPT4 Code: [id number] Description: 69450 - WC PHYS LEVEL 3 - EST PT Modifier: Quantity: 1 CPT4 Code: Description: ICD-10 Diagnosis Description I87.2 Venous insufficiency (chronic) (peripheral) L97.822 Non-pressure chronic ulcer of other part of left lower leg with fat lay I10 Essential (primary) hypertension Modifier: er exposed Quantity: Electronic Signature(s) Signed: 09/18/2021 4:58:26 PM By: Worthy Keeler PA-C Entered By: Worthy Keeler on 09/18/2021 16:58:26

## 2021-09-19 NOTE — Progress Notes (Signed)
ALDRIDGE, KRZYZANOWSKI ([id number]) Visit Report for 09/18/2021 Arrival Information Details Patient Name: [name], [name]. Date of Service: 09/18/2021 2:00 PM Medical Record Number: [id number] Patient Account Number: [id number] Date of Birth/Sex: [birth year] (60 y.o. M) Treating RN: Cornell Barman Primary Care Telesforo Brosnahan: PATIENT, NO Other Clinician: Referring Ashonti [name]: Rosalin Hawking Treating Norina Cowper/Extender: Skipper Cliche in Treatment: 10 Visit Information History Since Last Visit Added or deleted any medications: No Patient Arrived: Ambulatory Has Dressing in Place as Prescribed: Yes Arrival Time: 14:31 Pain Present Now: No Transfer Assistance: None Patient Identification Verified: Yes Secondary Verification Process Completed: Yes Patient Requires Transmission-Based Precautions: No Patient Has Alerts: No Electronic Signature(s) Signed: 09/18/2021 6:31:02 PM By: Gretta Cool, BSN, RN, CWS, Kim RN, BSN Entered By: Gretta Cool, BSN, RN, CWS, Kim on 09/18/2021 14:31:30 [name] ([id number]) -------------------------------------------------------------------------------- Clinic Level of Care Assessment Details Patient Name: [name], [name] Date of Service: 09/18/2021 2:00 PM Medical Record Number: [id number] Patient Account Number: [id number] Date of Birth/Sex: 1961/03/07 (60 y.o. M) Treating RN: Cornell Barman Primary Care Marializ Ferrebee: PATIENT, NO Other Clinician: Referring Akisha Sturgill: Rosalin Hawking Treating Jene Oravec/Extender: Skipper Cliche in Treatment: 10 Clinic Level of Care Assessment Items TOOL 4 Quantity Score []  - Use when only an EandM is performed on FOLLOW-UP visit 0 ASSESSMENTS - Nursing Assessment / Reassessment X - Reassessment of Co-morbidities (includes updates in patient status) 1 10 X- 1 5 Reassessment of Adherence to Treatment Plan ASSESSMENTS - Wound and Skin Assessment / Reassessment X - Simple Wound Assessment / Reassessment - one wound 1 5 []  -  0 Complex Wound Assessment / Reassessment - multiple wounds []  - 0 Dermatologic / Skin Assessment (not related to wound area) ASSESSMENTS - Focused Assessment []  - Circumferential Edema Measurements - multi extremities 0 []  - 0 Nutritional Assessment / Counseling / Intervention []  - 0 Lower Extremity Assessment (monofilament, tuning fork, pulses) []  - 0 Peripheral Arterial Disease Assessment (using hand held doppler) ASSESSMENTS - Ostomy and/or Continence Assessment and Care []  - Incontinence Assessment and Management 0 []  - 0 Ostomy Care Assessment and Management (repouching, etc.) PROCESS - Coordination of Care X - Simple Patient / Family Education for ongoing care 1 15 []  - 0 Complex (extensive) Patient / Family Education for ongoing care []  - 0 Staff obtains Programmer, systems, Records, Test Results / Process Orders []  - 0 Staff telephones HHA, Nursing Homes / Clarify orders / etc []  - 0 Routine Transfer to another Facility (non-emergent condition) []  - 0 Routine Hospital Admission (non-emergent condition) []  - 0 New Admissions / Biomedical engineer / Ordering NPWT, Apligraf, etc. []  - 0 Emergency Hospital Admission (emergent condition) X- 1 10 Simple Discharge Coordination []  - 0 Complex (extensive) Discharge Coordination PROCESS - Special Needs []  - Pediatric / Minor Patient Management 0 []  - 0 Isolation Patient Management []  - 0 Hearing / Language / Visual special needs []  - 0 Assessment of Community assistance (transportation, D/C planning, etc.) []  - 0 Additional assistance / Altered mentation []  - 0 Support Surface(s) Assessment (bed, cushion, seat, etc.) INTERVENTIONS - Wound Cleansing / Measurement [place], Nyron L. ([id number]) []  - 0 Simple Wound Cleansing - one wound []  - 0 Complex Wound Cleansing - multiple wounds X- 1 5 Wound Imaging (photographs - any number of wounds) []  - 0 Wound Tracing (instead of photographs) []  - 0 Simple Wound  Measurement - one wound []  - 0 Complex Wound Measurement - multiple wounds INTERVENTIONS - Wound Dressings []  - Small Wound Dressing one or multiple wounds 0 []  -  0 Medium Wound Dressing one or multiple wounds []  - 0 Large Wound Dressing one or multiple wounds []  - 0 Application of Medications - topical []  - 0 Application of Medications - injection INTERVENTIONS - Miscellaneous []  - External ear exam 0 []  - 0 Specimen Collection (cultures, biopsies, blood, body fluids, etc.) []  - 0 Specimen(s) / Culture(s) sent or taken to Lab for analysis []  - 0 Patient Transfer (multiple staff / Civil Service fast streamer / Similar devices) []  - 0 Simple Staple / Suture removal (25 or less) []  - 0 Complex Staple / Suture removal (26 or more) []  - 0 Hypo / Hyperglycemic Management (close monitor of Blood Glucose) []  - 0 Ankle / Brachial Index (ABI) - do not check if billed separately X- 1 5 Vital Signs Has the patient been seen at the hospital within the last three years: Yes Total Score: 55 Level Of Care: New/Established - Level 2 Electronic Signature(s) Signed: 09/18/2021 6:31:02 PM By: Gretta Cool, BSN, RN, CWS, Kim RN, BSN Entered By: Gretta Cool, BSN, RN, CWS, Kim on 09/18/2021 14:40:44 [name] ([id number]) -------------------------------------------------------------------------------- Encounter Discharge Information Details Patient Name: [name], [name] Date of Service: 09/18/2021 2:00 PM Medical Record Number: [id number] Patient Account Number: [id number] Date of Birth/Sex: 05/26/1961 (60 y.o. M) Treating RN: Cornell Barman Primary Care Telsa Dillavou: PATIENT, NO Other Clinician: Referring Heriberto Stmartin: Rosalin Hawking Treating Elaijah Munoz/Extender: Skipper Cliche in Treatment: 10 Encounter Discharge Information Items Discharge Condition: Stable Ambulatory Status: Ambulatory Discharge Destination: Home Transportation: Private Auto Schedule Follow-up Appointment: Yes Clinical Summary of  Care: Electronic Signature(s) Signed: 09/18/2021 6:31:02 PM By: Gretta Cool, BSN, RN, CWS, Kim RN, BSN Entered By: Gretta Cool, BSN, RN, CWS, Kim on 09/18/2021 14:41:56 [name] ([id number]) -------------------------------------------------------------------------------- Lower Extremity Assessment Details Patient Name: [name], [name] Date of Service: 09/18/2021 2:00 PM Medical Record Number: [id number] Patient Account Number: [id number] Date of Birth/Sex: 1961-01-06 (60 y.o. M) Treating RN: Cornell Barman Primary Care Jerremy Maione: PATIENT, NO Other Clinician: Referring Jermane Brayboy: Rosalin Hawking Treating Khyleigh Furney/Extender: Skipper Cliche in Treatment: 10 Edema Assessment Assessed: [Left: No] [Right: No] [Left: Edema] [Right: :] Calf Left: Right: Point of Measurement: 40 cm From Medial Instep 43 cm Ankle Left: Right: Point of Measurement: 12 cm From Medial Instep 30 cm Electronic Signature(s) Signed: 09/18/2021 6:31:02 PM By: Gretta Cool, BSN, RN, CWS, Kim RN, BSN Entered By: Gretta Cool, BSN, RN, CWS, Kim on 09/18/2021 14:33:12 Gerardo, Cheyenne Adas ([id number]) -------------------------------------------------------------------------------- Multi Wound Chart Details Patient Name: [name], [name] Date of Service: 09/18/2021 2:00 PM Medical Record Number: [id number] Patient Account Number: [id number] Date of Birth/Sex: Mar 20, 1961 (60 y.o. M) Treating RN: Cornell Barman Primary Care Ilanna Deihl: PATIENT, NO Other Clinician: Referring Bristyn Kulesza: Rosalin Hawking Treating Shamaine Mulkern/Extender: Skipper Cliche in Treatment: 10 Vital Signs Height(in): 74 Pulse(bpm): 56 Weight(lbs): 294 Blood Pressure(mmHg): 169/80 Body Mass Index(BMI): 38 Temperature(F): 98 Respiratory Rate(breaths/min): 18 Photos: [1:No Photos] [N/A:N/A] Wound Location: [1:Left, Lateral Lower Leg] [N/A:N/A] Wounding Event: [1:Gradually Appeared] [N/A:N/A] Primary Etiology: [1:Venous Leg Ulcer] [N/A:N/A] Date Acquired:  [1:05/09/2021] [N/A:N/A] Weeks of Treatment: [1:10] [N/A:N/A] Wound Status: [1:Healed - Epithelialized] [N/A:N/A] Measurements L x W x D (cm) [1:0x0x0] [N/A:N/A] Area (cm) : [1:0] [N/A:N/A] Volume (cm) : [1:0] [N/A:N/A] % Reduction in Area: [1:100.00%] [N/A:N/A] % Reduction in Volume: [1:100.00%] [N/A:N/A] Classification: [1:Full Thickness Without Exposed Support Structures] [N/A:N/A] Exudate Amount: [1:Medium] [N/A:N/A] Exudate Type: [1:Serosanguineous red, brown] [N/A:N/A N/A] Treatment Notes Electronic Signature(s) Signed: 09/18/2021 6:31:02 PM By: Gretta Cool, BSN, RN, CWS, Kim RN, BSN Entered By: Gretta Cool, BSN, RN, CWS, Kim on 09/18/2021 14:38:42  [name], [name] ([id number]) -------------------------------------------------------------------------------- [place] Details Patient Name: [name], BERKOWITZ. Date of Service: 09/18/2021 2:00 PM Medical Record Number: [id number] Patient Account Number: [id number] Date of Birth/Sex: 02-26-1961 (60 y.o. M) Treating RN: Cornell Barman Primary Care Shuayb Schepers: PATIENT, NO Other Clinician: Referring Sandra Brents: Rosalin Hawking Treating Ketra Duchesne/Extender: Skipper Cliche in Treatment: 10 Active Inactive Electronic Signature(s) Signed: 09/18/2021 6:31:02 PM By: Gretta Cool, BSN, RN, CWS, Kim RN, BSN Entered By: Gretta Cool, BSN, RN, CWS, Kim on 09/18/2021 14:37:13 [name] ([id number]) -------------------------------------------------------------------------------- Pain Assessment Details Patient Name: [name], [name] Date of Service: 09/18/2021 2:00 PM Medical Record Number: [id number] Patient Account Number: [id number] Date of Birth/Sex: April 20, 1961 (60 y.o. M) Treating RN: Cornell Barman Primary Care Carrol Bondar: PATIENT, NO Other Clinician: Referring Eris Hannan: Rosalin Hawking Treating Valetta Mulroy/Extender: Skipper Cliche in Treatment: 10 Active Problems Location of Pain Severity and Description of Pain Patient Has Paino  No Site Locations Pain Management and Medication Current Pain Management: Electronic Signature(s) Signed: 09/18/2021 6:31:02 PM By: Gretta Cool, BSN, RN, CWS, Kim RN, BSN Entered By: Gretta Cool, BSN, RN, CWS, Kim on 09/18/2021 14:32:25 [name] ([id number]) -------------------------------------------------------------------------------- Patient/Caregiver Education Details Patient Name: [name]. Date of Service: 09/18/2021 2:00 PM Medical Record Number: [id number] Patient Account Number: [id number] Date of Birth/Gender: October 24, 1961 (60 y.o. M) Treating RN: Cornell Barman Primary Care Physician: PATIENT, NO Other Clinician: Referring Physician: Rosalin Hawking Treating Physician/Extender: Skipper Cliche in Treatment: 10 Education Assessment Education Provided To: Patient Education Topics Provided Venous: Handouts: Controlling Swelling with Compression Stockings Methods: Demonstration, Explain/Verbal Responses: State content correctly Electronic Signature(s) Signed: 09/18/2021 6:31:02 PM By: Gretta Cool, BSN, RN, CWS, Kim RN, BSN Entered By: Gretta Cool, BSN, RN, CWS, Kim on 09/18/2021 14:41:20 [name] ([id number]) -------------------------------------------------------------------------------- Wound Assessment Details Patient Name: [name], [name] Date of Service: 09/18/2021 2:00 PM Medical Record Number: [id number] Patient Account Number: [id number] Date of Birth/Sex: [birth year] (60 y.o. M) Treating RN: Cornell Barman Primary Care Chilton Sallade: PATIENT, NO Other Clinician: Referring Cheyan Frees: Rosalin Hawking Treating Macen Joslin/Extender: Skipper Cliche in Treatment: 10 Wound Status Wound Number: 1 Primary Etiology: Venous Leg Ulcer Wound Location: Left, Lateral Lower Leg Wound Status: Healed - Epithelialized Wounding Event: Gradually Appeared Date Acquired: 05/09/2021 Weeks Of Treatment: 10 Clustered Wound: No Photos Photo Uploaded By: Gretta Cool, BSN, RN, CWS, Kim  on 09/18/2021 14:44:12 Wound Measurements Length: (cm) 0 Width: (cm) 0 Depth: (cm) 0 Area: (cm) Volume: (cm) % Reduction in Area: 100% % Reduction in Volume: 100% 0 0 Wound Description Classification: Full Thickness Without Exposed Support Structu Exudate Amount: Medium Exudate Type: Serosanguineous Exudate Color: red, brown res Electronic Signature(s) Signed: 09/18/2021 6:31:02 PM By: Gretta Cool, BSN, RN, CWS, Kim RN, BSN Entered By: Gretta Cool, BSN, RN, CWS, Kim on 09/18/2021 14:32:41 [place], Cheyenne Adas ([id number]) -------------------------------------------------------------------------------- Vitals Details Patient Name: [name], Azhar L. Date of Service: 09/18/2021 2:00 PM Medical Record Number: [id number] Patient Account Number: [id number] Date of Birth/Sex: [birth year] (60 y.o. M) Treating RN: Cornell Barman Primary Care Sie Formisano: PATIENT, NO Other Clinician: Referring Maeven Mcdougall: Rosalin Hawking Treating Emmalyn Hinson/Extender: Skipper Cliche in Treatment: 10 Vital Signs Time Taken: 14:31 Temperature (F): 98 Height (in): 74 Pulse (bpm): 56 Weight (lbs): 294 Respiratory Rate (breaths/min): 18 Body Mass Index (BMI): 37.7 Blood Pressure (mmHg): 169/80 Reference Range: 80 - 120 mg / dl Electronic Signature(s) Signed: 09/18/2021 6:31:02 PM By: Gretta Cool, BSN, RN, CWS, Kim RN, BSN Entered By: Gretta Cool, BSN, RN, CWS, Kim on 09/18/2021 14:32:03

## 2023-11-19 ENCOUNTER — Emergency Department (HOSPITAL_COMMUNITY): Payer: Worker's Compensation

## 2023-11-19 ENCOUNTER — Other Ambulatory Visit (HOSPITAL_COMMUNITY): Payer: Self-pay

## 2023-11-19 ENCOUNTER — Emergency Department (HOSPITAL_COMMUNITY)
Admission: EM | Admit: 2023-11-19 | Discharge: 2023-11-19 | Disposition: A | Discharge status: Home / Self Care | Payer: Worker's Compensation | Attending: Emergency Medicine | Admitting: Emergency Medicine

## 2023-11-19 ENCOUNTER — Encounter (HOSPITAL_COMMUNITY): Payer: Self-pay | Admitting: Emergency Medicine

## 2023-11-19 ENCOUNTER — Other Ambulatory Visit: Payer: Self-pay

## 2023-11-19 DIAGNOSIS — S32028A Other fracture of second lumbar vertebra, initial encounter for closed fracture: Secondary | ICD-10-CM | POA: Insufficient documentation

## 2023-11-19 DIAGNOSIS — W19XXXA Unspecified fall, initial encounter: Secondary | ICD-10-CM | POA: Diagnosis not present

## 2023-11-19 DIAGNOSIS — S3992XA Unspecified injury of lower back, initial encounter: Secondary | ICD-10-CM | POA: Diagnosis present

## 2023-11-19 MED ORDER — KETOROLAC TROMETHAMINE 30 MG/ML IJ SOLN
30.0000 mg | Freq: Once | INTRAMUSCULAR | Status: AC
  Administered 2023-11-19: 30 mg via INTRAMUSCULAR
  Filled 2023-11-19: qty 1

## 2023-11-19 MED ORDER — DIAZEPAM 2 MG PO TABS
2.0000 mg | ORAL_TABLET | Freq: Once | ORAL | Status: AC
  Administered 2023-11-19: 2 mg via ORAL
  Filled 2023-11-19: qty 1

## 2023-11-19 MED ORDER — HYDROMORPHONE HCL 1 MG/ML IJ SOLN
1.0000 mg | Freq: Once | INTRAMUSCULAR | Status: AC
  Administered 2023-11-19: 1 mg via INTRAMUSCULAR
  Filled 2023-11-19: qty 1

## 2023-11-19 MED ORDER — OXYCODONE HCL 5 MG PO TABS
5.0000 mg | ORAL_TABLET | Freq: Four times a day (QID) | ORAL | 0 refills | Status: AC | PRN
  Filled 2023-11-19: qty 15, 4d supply, fill #0

## 2023-11-19 NOTE — ED Provider Notes (Signed)
Patient initially seen by Dr. Consuella Lose.  Please see his note.  Patient presented to the ED for evaluation of back pain after a fall Physical Exam  BP (!) 152/94   Pulse (!) 52   Temp 97.9 F (36.6 C) (Oral)   Resp 18   SpO2 96%   Physical Exam Constitutional:      General: He is not in acute distress. Pulmonary:     Effort: Pulmonary effort is normal.  Neurological:     Mental Status: He is alert.     Sensory: No sensory deficit.     Motor: No weakness.     Comments: 5-5 plantarflexion dorsiflexion strength bilateral lower extremities, normal sensation     Procedures  Procedures  ED Course / MDM   Clinical Course as of 11/19/23 0906  Fri Nov 19, 2023  0804 Patient's cervical spine CT and pelvis CT did not show any acute findings. [JK]  0805   Lumbar spine CT does show an Acute L2 body fracture with mild superior endplate depression. Suspect ventral epidural hemorrhage from the fracture at the level of L1 and L2.   [JK]  0981 Case discussed with Neurosurgery, Leo Grosser. Pt can be placed in a brace and follow up as outpatient with neurosurgery [JK]    Clinical Course User Index [JK] Linwood Dibbles, MD   Medical Decision Making Amount and/or Complexity of Data Reviewed Radiology: ordered.  Risk Prescription drug management.   Patient presented to the ED for evaluation of fall after slipping on ice.  Patient does have an L2 body fracture.  Pt has normal neurologic function. TLSO brace ordered.  Case discussed with neurosurgery.  Patient is appropriate for outpatient management       Linwood Dibbles, MD 11/19/23 [phone]

## 2023-11-19 NOTE — ED Notes (Signed)
Patient transported to CT 

## 2023-11-19 NOTE — ED Triage Notes (Signed)
BIBA Per EMS: Pt coming from work from [employer], pt fell backwards. C/o midline back pain. Denies hitting head, denies thinners, denies LOC. No c-collar due to it not fitting neck.  162palp 60HR  16RR 97% RA 98CBG

## 2023-11-19 NOTE — ED Notes (Addendum)
Ortho tech called to have brace readjusted

## 2023-11-19 NOTE — ED Notes (Signed)
Ortho called for TLSO brace, VM left

## 2023-11-19 NOTE — Progress Notes (Signed)
Orthopedic Tech Progress Note Patient Details:  [name] 09-Jan-1961 [id number]  Ortho Devices Type of Ortho Device: Thoracolumbar corset (TLSO) Ortho Device/Splint Interventions: Application   Post Interventions Patient Tolerated: Well  Genelle Bal Holman Bonsignore 11/19/2023, 9:39 AM

## 2023-11-19 NOTE — Discharge Instructions (Addendum)
Follow-up with the neurosurgeon.  Call their office to schedule an appointment.  Take the medications as needed for pain.  Wear the brace when you are up and ambulating.  Return to the emergency room if you start having weakness numbness or other concerning symptoms.  The oxycodone is for severe pain.  You can also supplement with Tylenol and over-the-counter lidocaine pain patches.

## 2023-11-19 NOTE — ED Provider Notes (Signed)
[place] EMERGENCY DEPARTMENT AT [facility] Provider Note   CSN: [id number] Arrival date & time: 11/19/23  9147     History  Chief Complaint  Patient presents with   [name] is a 63 y.o. male.  Patient works for [employer].  Here after falling.  States she was trying to help someone move something and slipped on the ice falling backwards onto his low back and buttocks.  Denies hitting head or losing consciousness.  Complains of pain to his mid low back that radiates to his right side.  No focal weakness, numbness or tingling.  No bowel or bladder incontinence.  No history of chronic back problems.  No history of back surgeries.  No history of IV drug abuse or cancer.  Denies any head, neck, chest or abdominal pain.  No blood thinner use. No incontinence.  No ration of the pain down his legs.  No pain prior to falling.  The history is provided by the patient and the EMS personnel.  Fall Pertinent negatives include no chest pain, no abdominal pain, no headaches and no shortness of breath.       Home Medications Prior to Admission medications   Medication Sig Start Date End Date Taking? Authorizing Provider  amLODipine (NORVASC) 10 MG tablet Take 10 mg by mouth 2 (two) times daily.      [provider]  gemfibrozil (LOPID) 600 MG tablet Take 1 tablet (600 mg total) by mouth 2 (two) times daily before a meal. Patient not taking: Reported on 08/17/2017 08/01/14   Quentin Angst, MD  ibuprofen (ADVIL,MOTRIN) 200 MG tablet Take 400 mg by mouth every 6 (six) hours as needed for moderate pain.    [provider]  lisinopril (PRINIVIL,ZESTRIL) 40 MG tablet Take 40 mg by mouth daily. 08/16/17   [provider]  metoprolol tartrate (LOPRESSOR) 100 MG tablet Take 100 mg by mouth 2 (two) times daily. 08/05/17   [provider]  Multiple Vitamin (MULTIVITAMIN WITH MINERALS) TABS tablet Take 1 tablet by mouth daily.    [provider]  naproxen sodium (ANAPROX) 220 MG tablet Take 220 mg by mouth 2 (two) times daily with a meal.    [provider]  nitroGLYCERIN (NITROSTAT) 0.4 MG SL tablet Place 1 tablet (0.4 mg total) under the tongue every 5 (five) minutes as needed for chest pain. 08/18/17   Maurilio Lovely D, DO      Allergies    Patient has no known allergies.    Review of Systems   Review of Systems  Constitutional:  Negative for activity change, appetite change and fever.  HENT:  Negative for congestion and postnasal drip.   Respiratory:  Negative for cough and shortness of breath.   Cardiovascular:  Negative for chest pain.  Gastrointestinal:  Negative for abdominal pain, nausea and vomiting.  Genitourinary:  Negative for dysuria and hematuria.  Musculoskeletal:  Positive for arthralgias, back pain and myalgias.  Skin:  Negative for rash.  Neurological:  Negative for dizziness, weakness and headaches.   all other systems are negative except as noted in the HPI and PMH.    Physical Exam Updated Vital Signs BP (!) 152/94   Pulse (!) 52   Temp 97.9 F (36.6 C) (Oral)   Resp 18   SpO2 96%  Physical Exam Vitals and nursing note reviewed.  Constitutional:      General: He is not in acute distress.  Appearance: He is well-developed.     Comments: Uncomfortable  HENT:     Head: Normocephalic and atraumatic.     Mouth/Throat:     Pharynx: No oropharyngeal exudate.  Eyes:     Conjunctiva/sclera: Conjunctivae normal.     Pupils: Pupils are equal, round, and reactive to light.  Neck:     Comments: No midline C-spine pain Cardiovascular:     Rate and Rhythm: Normal rate and regular rhythm.     Heart sounds: Normal heart sounds. No murmur heard. Pulmonary:     Effort: Pulmonary effort is normal. No respiratory distress.     Breath sounds: Normal breath sounds.  Abdominal:     Palpations: Abdomen is soft.     Tenderness: There is no abdominal tenderness. There is no guarding or  rebound.  Musculoskeletal:        General: Tenderness present. Normal range of motion.     Cervical back: Normal range of motion and neck supple.     Comments: Tender lumbar spine in the midline without step-off or deformity.  5/5 strength in bilateral lower extremities. Ankle plantar and dorsiflexion intact. Great toe extension intact bilaterally. +2 DP and PT pulses.   Skin:    General: Skin is warm.  Neurological:     Mental Status: He is alert and oriented to person, place, and time.     Cranial Nerves: No cranial nerve deficit.     Motor: No abnormal muscle tone.     Coordination: Coordination normal.     Comments: No ataxia on finger to nose bilaterally. No pronator drift. 5/5 strength throughout. CN 2-12 intact.Equal grip strength. Sensation intact.   Psychiatric:        Behavior: Behavior normal.     ED Results / Procedures / Treatments   Labs (all labs ordered are listed, but only abnormal results are displayed) Labs Reviewed - No data to display  EKG None  Radiology No results found.  Procedures Procedures    Medications Ordered in ED Medications  HYDROmorphone (DILAUDID) injection 1 mg (has no administration in time range)  ketorolac (TORADOL) 30 MG/ML injection 30 mg (has no administration in time range)  diazepam (VALIUM) tablet 2 mg (has no administration in time range)    ED Course/ Medical Decision Making/ A&P                                 Medical Decision Making Amount and/or Complexity of Data Reviewed Independent Historian: EMS Labs: ordered. Decision-making details documented in ED Course. Radiology: ordered and independent interpretation performed. Decision-making details documented in ED Course. ECG/medicine tests: ordered and independent interpretation performed. Decision-making details documented in ED Course.  Risk Prescription drug management.   Mechanical fall with low back pain.  Neurovascularly intact.  No incontinence.  Low  suspicion for cord compression or cauda equina.  Will traumatic imaging and treat pain.  CT imaging pending at shift change.  Medications to be given to treat his pain.  Neurovascularly intact with low suspicion for cord compression or cauda equina. Care to be transferred at shift change.       Final Clinical Impression(s) / ED Diagnoses Final diagnoses:  Fall, initial encounter  Acute midline low back pain without sciatica    Rx / DC Orders ED Discharge Orders     None         Cahterine Heinzel, Jeannett Senior, MD 11/19/23 [phone]
